# Patient Record
Sex: Female | Born: 1955 | Race: White | Hispanic: No | Marital: Married | Smoking: Never smoker
Health system: Southern US, Community
[De-identification: ages and names within clinical notes are randomized; demographics above are authoritative.]

## PROBLEM LIST (undated history)

## (undated) DIAGNOSIS — B019 Varicella without complication: Secondary | ICD-10-CM

## (undated) DIAGNOSIS — N809 Endometriosis, unspecified: Secondary | ICD-10-CM

## (undated) DIAGNOSIS — R12 Heartburn: Secondary | ICD-10-CM

## (undated) DIAGNOSIS — Z9189 Other specified personal risk factors, not elsewhere classified: Secondary | ICD-10-CM

## (undated) DIAGNOSIS — K219 Gastro-esophageal reflux disease without esophagitis: Secondary | ICD-10-CM

## (undated) DIAGNOSIS — M6281 Muscle weakness (generalized): Secondary | ICD-10-CM

## (undated) DIAGNOSIS — I10 Essential (primary) hypertension: Secondary | ICD-10-CM

## (undated) DIAGNOSIS — J302 Other seasonal allergic rhinitis: Secondary | ICD-10-CM

## (undated) DIAGNOSIS — T7840XA Allergy, unspecified, initial encounter: Secondary | ICD-10-CM

## (undated) DIAGNOSIS — M1990 Unspecified osteoarthritis, unspecified site: Secondary | ICD-10-CM

## (undated) HISTORY — DX: Other specified personal risk factors, not elsewhere classified: Z91.89

## (undated) HISTORY — DX: Other seasonal allergic rhinitis: J30.2

## (undated) HISTORY — DX: Gastro-esophageal reflux disease without esophagitis: K21.9

## (undated) HISTORY — DX: Essential (primary) hypertension: I10

## (undated) HISTORY — DX: Endometriosis, unspecified: N80.9

## (undated) HISTORY — DX: Heartburn: R12

## (undated) HISTORY — DX: Unspecified osteoarthritis, unspecified site: M19.90

## (undated) HISTORY — DX: Varicella without complication: B01.9

## (undated) HISTORY — DX: Allergy, unspecified, initial encounter: T78.40XA

## (undated) HISTORY — PX: RHINOPLASTY: SUR1284

## (undated) HISTORY — PX: WISDOM TOOTH EXTRACTION: SHX21

---
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
## 1984-01-26 HISTORY — PX: LAPAROSCOPIC ENDOMETRIOSIS FULGURATION: SUR769

## 2012-03-31 HISTORY — PX: COLONOSCOPY: SHX174

## 2014-04-26 DIAGNOSIS — Z872 Personal history of diseases of the skin and subcutaneous tissue: Secondary | ICD-10-CM

## 2014-04-26 HISTORY — DX: Personal history of diseases of the skin and subcutaneous tissue: Z87.2

## 2016-10-20 LAB — CBC AND DIFFERENTIAL
HCT: 40 (ref 36–46)
Hemoglobin: 13.7 (ref 12.0–16.0)
Platelets: 350 (ref 150–399)
WBC: 5.4

## 2016-10-20 LAB — BASIC METABOLIC PANEL
BUN: 8 (ref 4–21)
Creatinine: 0.9 (ref 0.5–1.1)
Potassium: 4.5 (ref 3.4–5.3)
Sodium: 138 (ref 137–147)

## 2016-10-20 LAB — LIPID PANEL
Cholesterol: 221 — AB (ref 0–200)
HDL: 99 — AB (ref 35–70)
LDL Cholesterol: 105
Triglycerides: 87 (ref 40–160)

## 2016-10-20 LAB — HEPATIC FUNCTION PANEL
ALT: 14 (ref 7–35)
AST: 20 (ref 13–35)
Bilirubin, Total: 0.4

## 2016-10-20 LAB — CBC: RBC: 4.22 (ref 3.87–5.11)

## 2016-10-20 LAB — TSH: TSH: 2.06 (ref 0.41–5.90)

## 2016-10-20 LAB — HEMOGLOBIN A1C: Hemoglobin A1C: 5.6

## 2016-10-20 LAB — POCT INR: INR: 1 (ref 0.9–1.1)

## 2016-10-25 HISTORY — PX: BUNIONECTOMY: SHX129

## 2016-12-25 DIAGNOSIS — M869 Osteomyelitis, unspecified: Secondary | ICD-10-CM

## 2016-12-25 HISTORY — DX: Osteomyelitis, unspecified: M86.9

## 2017-01-25 DIAGNOSIS — G928 Other toxic encephalopathy: Secondary | ICD-10-CM

## 2017-01-25 DIAGNOSIS — M6281 Muscle weakness (generalized): Secondary | ICD-10-CM

## 2017-01-25 HISTORY — DX: Muscle weakness (generalized): M62.81

## 2017-01-25 HISTORY — DX: Other toxic encephalopathy: G92.8

## 2017-02-08 LAB — HEPATIC FUNCTION PANEL
ALT: 9 (ref 7–35)
AST: 23 (ref 13–35)

## 2017-02-08 LAB — CBC: RBC: 4.05 (ref 3.87–5.11)

## 2017-02-08 LAB — CBC AND DIFFERENTIAL
HCT: 38 (ref 36–46)
Hemoglobin: 12.4 (ref 12.0–16.0)
Platelets: 350 (ref 150–399)
WBC: 7

## 2017-02-08 LAB — BASIC METABOLIC PANEL
BUN: 5 (ref 4–21)
Creatinine: 0.9 (ref 0.5–1.1)

## 2017-02-08 LAB — HM MAMMOGRAPHY

## 2017-02-08 LAB — POCT INR: INR: 0.9 (ref 0.9–1.1)

## 2018-11-17 ENCOUNTER — Other Ambulatory Visit: Payer: Self-pay

## 2018-11-17 ENCOUNTER — Encounter: Payer: Self-pay | Admitting: Family Medicine

## 2018-11-17 ENCOUNTER — Ambulatory Visit (INDEPENDENT_AMBULATORY_CARE_PROVIDER_SITE_OTHER): Payer: BC Managed Care – PPO | Admitting: Family Medicine

## 2018-11-17 VITALS — BP 128/84 | HR 82 | Temp 98.1°F | Resp 16 | Ht 62.25 in | Wt 127.2 lb

## 2018-11-17 DIAGNOSIS — I10 Essential (primary) hypertension: Secondary | ICD-10-CM | POA: Diagnosis not present

## 2018-11-17 DIAGNOSIS — Z0000 Encounter for general adult medical examination without abnormal findings: Secondary | ICD-10-CM

## 2018-11-17 DIAGNOSIS — K219 Gastro-esophageal reflux disease without esophagitis: Secondary | ICD-10-CM | POA: Insufficient documentation

## 2018-11-17 MED ORDER — FAMOTIDINE 20 MG PO TABS: 20.0000 mg | ORAL_TABLET | Freq: Two times a day (BID) | ORAL | 11 refills | Status: DC

## 2018-11-17 MED ORDER — LOSARTAN POTASSIUM-HCTZ 50-12.5 MG PO TABS: 1.0000 | ORAL_TABLET | Freq: Every day | ORAL | 5 refills | Status: DC

## 2018-11-17 NOTE — Progress Notes (Signed)
Patient ID: Jasmine Lee, female  DOB: 11/08/1955, 63 y.o.   MRN: [id number] Patient Care Team    Relationship Specialty Notifications Start End  Natalia Leatherwood, DO PCP - General Family Medicine  11/17/18     Chief Complaint  Patient presents with  . Establish Care    PCP Dr Hosie Poisson, [place]. Mammogram 02/2018, Pap 2015?Marland Kitchen Colonoscopy 8 yrs ago, repeat in 10 yrs. Records requested. Needs refill on Famotidine or change medication. Pt thinks she had Td vaccine a few years ago but is unsure.     Subjective:  Jasmine Lee is a 63 y.o.  female present for new patient establishment. All past medical history, surgical history, allergies, family history, immunizations, medications and social history were updated in the electronic medical record today. All recent labs, ED visits and hospitalizations within the last year were reviewed.  Hypertension: Pt reports compliance with losartan/HCTZ 50-12.5 mg daily. Blood pressures ranges at home within normal limits. Patient denies chest pain, shortness of breath or lower extremity edema. Pt does not take a daily baby ASA. Pt is not prescribed statin.  She does take daily 1000 mg of omega-3.   GERD: Patient reports she has been prescribed famotidine 40 mg daily.  She states this typically controls her reflux, however she has been having a hard time getting the medication refilled secondary to backorder.  Depression screen PHQ 2/9 11/17/2018  Decreased Interest 0  Down, Depressed, Hopeless 0  PHQ - 2 Score 0   No flowsheet data found.     No flowsheet data found.   There is no immunization history on file for this patient.  No exam data present  Past Medical History:  Diagnosis Date  . Arthritis   . Chicken pox   . GERD (gastroesophageal reflux disease)   . Heartburn   . History of fainting spells of unknown cause   . HTN (hypertension)   . Muscle weakness 01/2017   Patient reports being hospitalized for bilateral upper  extremity weakness with negative stroke work-up.  Negative EEG.  Self resolved  . Seasonal allergies    No Known Allergies Past Surgical History:  Procedure Laterality Date  . BUNIONECTOMY  10/2016   Patient reports multiple postsurgical infections October/November and December 2018.  Marland Kitchen COLONOSCOPY  2012  . HERNIA REPAIR  1962  . LAPAROSCOPIC ENDOMETRIOSIS FULGURATION  1986  . RHINOPLASTY     Completed for deviated septum.  . WISDOM TOOTH EXTRACTION     Family History  Problem Relation Age of Onset  . Arthritis Mother   . Hearing loss Mother   . Heart attack Mother   . Miscarriages / [place] Mother   . Hyperlipidemia Mother   . Breast cancer Mother   . Alcohol abuse Father   . Diabetes Father   . Hypertension Father   . Stroke Father   . Diabetes Sister   . Early death Sister   . Heart disease Sister   . Stroke Sister   . Arthritis Maternal Grandmother   . Hypertension Maternal Grandmother   . Diabetes Maternal Grandfather   . Early death Maternal Grandfather   . Hyperlipidemia Maternal Grandfather   . Hypertension Maternal Grandfather   . Heart attack Maternal Grandfather   . Heart attack Paternal Grandmother   . Early death Paternal Grandfather   . Heart attack Paternal Grandfather   . Depression Sister    Social History   Social History Narrative   Marital status/children/pets: Married.  No children.   Education/employment: Associates degree, retired Airline pilot.   Safety:      -smoke alarm in the home:Yes     - wears seatbelt: Yes     - Feels safe in their relationships: Yes    Allergies as of 11/17/2018   No Known Allergies     Medication List       Accurate as of November 17, 2018 11:59 PM. If you have any questions, ask your nurse or doctor.        Calcium 250 MG Caps Take 2 capsules by mouth daily.   co-enzyme Q-10 30 MG capsule Take 100 mg by mouth daily.   famotidine 20 MG tablet Commonly known as: PEPCID Take 1 tablet (20 mg  total) by mouth 2 (two) times daily. What changed:   medication strength  how much to take Changed by: Howard Pouch, DO   Fish Oil 1000 MG Caps Take by mouth.   loratadine 10 MG tablet Commonly known as: CLARITIN Take 10 mg by mouth daily.   losartan-hydrochlorothiazide 50-12.5 MG tablet Commonly known as: HYZAAR Take 1 tablet by mouth daily.   MULTIVITAMIN ADULT PO Take by mouth.       All past medical history, surgical history, allergies, family history, immunizations andmedications were updated in the EMR today and reviewed under the history and medication portions of their EMR.    No results found for this or any previous visit (from the past 2160 hour(s)).  Patient was never admitted.   ROS: 14 pt review of systems performed and negative (unless mentioned in an HPI)  Objective: BP 128/84 (BP Location: Left Arm, Patient Position: Sitting, Cuff Size: Normal)   Pulse 82   Temp 98.1 F (36.7 C) (Temporal)   Resp 16   Ht 5' 2.25" (1.581 m)   Wt 127 lb 4 oz (57.7 kg)   SpO2 98%   BMI 23.09 kg/m  Gen: Afebrile. No acute distress. Nontoxic in appearance, well-developed, well-nourished, pleasant female. HENT: AT. [place].  Eyes:Pupils Equal Round Reactive to light, Extraocular movements intact,  Conjunctiva without redness, discharge or icterus. Neck/lymp/endocrine: Supple, no lymphadenopathy, no thyromegaly CV: RRR no murmur, no edema, +2/4 P posterior tibialis pulses.  No carotid bruits. No JVD. Chest: CTAB, no wheeze, rhonchi or crackles.  Normal respiratory effort.  Good air movement. Abd: Soft.  Flat. NTND. BS present.  Skin:Warm and well-perfused. Skin intact. Neuro/Msk:  Normal gait. PERLA. EOMi. Alert. Oriented x3.  Psych: Normal affect, dress and demeanor. Normal speech. Normal thought content and judgment.   Assessment/plan: Jasmine Lee is a 63 y.o. female present for EST.  Essential hypertension Stable.  Continue losartan/HCTZ 50-12.5 mg daily.  Refills  provided for her today. Patient reports she had recent labs collected with her PCP.  Will await records.   Low-sodium diet encouraged.  Routine exercise encouraged. Follow-up in 5-6 months before needing refills.  This can be her CPE if she is due.  Once records received will be able to guide her on when to follow-up.  GERD without esophagitis Refilled Protonix 20 mg twice daily for her. Follow-up yearly with physicals   Return in about 23 weeks (around 04/27/2019) for CPE (30 min).  Greater than 30 minutes spent with patient, >50% of time spent face to face   Note is dictated utilizing voice recognition software. Although note has been proof read prior to signing, occasional typographical errors still can be missed. If any questions arise, please do not hesitate to call  for verification.  Electronically signed by: Howard Pouch, DO [place]

## 2018-11-17 NOTE — Patient Instructions (Addendum)
Nice to meet you today.  I have refilled your meds for you.  Follow up in 5-6 months for physical. They will call to schedule you.   Please help [place] help you:  We are honored you have chosen [facility] for your Primary Care home. Below you will find basic instructions that you may need to access in the future. Please help [place] help you by reading the instructions, which cover many of the frequent questions we experience.   Prescription refills and request:  -In order to allow more efficient response time, please call your pharmacy for all refills. They will forward the request electronically to [place]. This allows for the quickest possible response. Request left on a nurse line can take longer to refill, since these are checked as time allows between office patients and other phone calls.  - refill request can take up to 3-5 working days to complete.  - If request is sent electronically and request is appropiate, it is usually completed in 1-2 business days.  - all patients will need to be seen routinely for all chronic medical conditions requiring prescription medications (see follow-up below). If you are overdue for follow up on your condition, you will be asked to make an appointment and we will call in enough medication to cover you until your appointment (up to 30 days).  - all controlled substances will require a face to face visit to request/refill.  - if you desire your prescriptions to go through a new pharmacy, and have an active script at original pharmacy, you will need to call your pharmacy and have scripts transferred to new pharmacy. This is completed between the pharmacy locations and not by your provider.    Results: If any images or labs were ordered, it can take up to 1 week to get results depending on the test ordered and the lab/facility running and resulting the test. - Normal or stable results, which do not need further discussion, may be released to your mychart immediately  with attached note to you. A call may not be generated for normal results. Please make certain to sign up for mychart. If you have questions on how to activate your mychart you can call the front office.  - If your results need further discussion, our office will attempt to contact you via phone, and if unable to reach you after 2 attempts, we will release your abnormal result to your mychart with instructions.  - All results will be automatically released in mychart after 1 week.  - Your provider will provide you with explanation and instruction on all relevant material in your results. Please keep in mind, results and labs may appear confusing or abnormal to the untrained eye, but it does not mean they are actually abnormal for you personally. If you have any questions about your results that are not covered, or you desire more detailed explanation than what was provided, you should make an appointment with your provider to do so.   Our office handles many outgoing and incoming calls daily. If we have not contacted you within 1 week about your results, please check your mychart to see if there is a message first and if not, then contact our office.  In helping with this matter, you help decrease call volume, and therefore allow [place] to be able to respond to patients needs more efficiently.   Acute office visits (sick visit):  An acute visit is intended for a new problem and are scheduled  in shorter time slots to allow schedule openings for patients with new problems. This is the appropriate visit to discuss a new problem. Problems will not be addressed by phone call or Echart message. Appointment is needed if requesting treatment. In order to provide you with excellent quality medical care with proper time for you to explain your problem, have an exam and receive treatment with instructions, these appointments should be limited to one new problem per visit. If you experience a new problem, in which you  desire to be addressed, please make an acute office visit, we save openings on the schedule to accommodate you. Please do not save your new problem for any other type of visit, let us take care of it properly and quickly for you.   Follow up visits:  Depending on your condition(s) your provider will need to see you routinely in order to provide you with quality care and prescribe medication(s). Most chronic conditions (Example: hypertension, Diabetes, depression/anxiety... etc), require visits a couple times a year. Your provider will instruct you on proper follow up for your personal medical conditions and history. Please make certain to make follow up appointments for your condition as instructed. Failing to do so could result in lapse in your medication treatment/refills. If you request a refill, and are overdue to be seen on a condition, we will always provide you with a 30 day script (once) to allow you time to schedule.    Medicare wellness (well visit): - we have a wonderful Nurse Maudie Mercury), that will meet with you and provide you will yearly medicare wellness visits. These visits should occur yearly (can not be scheduled less than 1 calendar year apart) and cover preventive health, immunizations, advance directives and screenings you are entitled to yearly through your medicare benefits. Do not miss out on your entitled benefits, this is when medicare will pay for these benefits to be ordered for you.  These are strongly encouraged by your provider and is the appropriate type of visit to make certain you are up to date with all preventive health benefits. If you have not had your medicare wellness exam in the last 12 months, please make certain to schedule one by calling the office and schedule your medicare wellness with Maudie Mercury as soon as possible.   Yearly physical (well visit):  - Adults are recommended to be seen yearly for physicals. Check with your insurance and date of your last physical, most  insurances require one calendar year between physicals. Physicals include all preventive health topics, screenings, medical exam and labs that are appropriate for gender/age and history. You may have fasting labs needed at this visit. This is a well visit (not a sick visit), new problems should not be covered during this visit (see acute visit).  - Pediatric patients are seen more frequently when they are younger. Your provider will advise you on well child visit timing that is appropriate for your their age. - This is not a medicare wellness visit. Medicare wellness exams do not have an exam portion to the visit. Some medicare companies allow for a physical, some do not allow a yearly physical. If your medicare allows a yearly physical you can schedule the medicare wellness with our nurse Maudie Mercury and have your physical with your provider after, on the same day. Please check with insurance for your full benefits.   Late Policy/No Shows:  - all new patients should arrive 15-30 minutes earlier than appointment to allow [place] time  to  obtain all personal demographics,  insurance information and for you to complete office paperwork. - All established patients should arrive 10-15 minutes earlier than appointment time to update all information and be checked in .  - In our best efforts to run on time, if you are late for your appointment you will be asked to either reschedule or if able, we will work you back into the schedule. There will be a wait time to work you back in the schedule,  depending on availability.  - If you are unable to make it to your appointment as scheduled, please call 24 hours ahead of time to allow [place] to fill the time slot with someone else who needs to be seen. If you do not cancel your appointment ahead of time, you may be charged a no show fee.

## 2018-11-20 ENCOUNTER — Encounter: Payer: Self-pay | Admitting: Family Medicine

## 2018-11-20 DIAGNOSIS — Z803 Family history of malignant neoplasm of breast: Secondary | ICD-10-CM | POA: Insufficient documentation

## 2018-11-28 DIAGNOSIS — M79674 Pain in right toe(s): Secondary | ICD-10-CM | POA: Diagnosis not present

## 2018-11-28 DIAGNOSIS — M21611 Bunion of right foot: Secondary | ICD-10-CM | POA: Diagnosis not present

## 2018-11-28 DIAGNOSIS — M85871 Other specified disorders of bone density and structure, right ankle and foot: Secondary | ICD-10-CM | POA: Diagnosis not present

## 2018-11-28 DIAGNOSIS — M19071 Primary osteoarthritis, right ankle and foot: Secondary | ICD-10-CM | POA: Diagnosis not present

## 2018-11-28 DIAGNOSIS — M2042 Other hammer toe(s) (acquired), left foot: Secondary | ICD-10-CM | POA: Diagnosis not present

## 2018-11-28 DIAGNOSIS — M2041 Other hammer toe(s) (acquired), right foot: Secondary | ICD-10-CM | POA: Diagnosis not present

## 2018-12-14 ENCOUNTER — Encounter: Payer: Self-pay | Admitting: Family Medicine

## 2018-12-14 DIAGNOSIS — M1990 Unspecified osteoarthritis, unspecified site: Secondary | ICD-10-CM | POA: Insufficient documentation

## 2018-12-28 DIAGNOSIS — L408 Other psoriasis: Secondary | ICD-10-CM | POA: Diagnosis not present

## 2018-12-28 DIAGNOSIS — L570 Actinic keratosis: Secondary | ICD-10-CM | POA: Diagnosis not present

## 2019-01-09 DIAGNOSIS — M2041 Other hammer toe(s) (acquired), right foot: Secondary | ICD-10-CM | POA: Diagnosis not present

## 2019-01-09 DIAGNOSIS — L84 Corns and callosities: Secondary | ICD-10-CM | POA: Diagnosis not present

## 2019-01-09 DIAGNOSIS — M21619 Bunion of unspecified foot: Secondary | ICD-10-CM | POA: Diagnosis not present

## 2019-01-09 DIAGNOSIS — M79674 Pain in right toe(s): Secondary | ICD-10-CM | POA: Diagnosis not present

## 2019-01-11 DIAGNOSIS — X32XXXA Exposure to sunlight, initial encounter: Secondary | ICD-10-CM | POA: Diagnosis not present

## 2019-01-11 DIAGNOSIS — L570 Actinic keratosis: Secondary | ICD-10-CM | POA: Diagnosis not present

## 2019-01-27 ENCOUNTER — Encounter: Payer: Self-pay | Admitting: Family Medicine

## 2019-01-27 ENCOUNTER — Telehealth: Payer: Self-pay | Admitting: Family Medicine

## 2019-01-27 DIAGNOSIS — Z1231 Encounter for screening mammogram for malignant neoplasm of breast: Secondary | ICD-10-CM

## 2019-01-27 NOTE — Telephone Encounter (Signed)
Please inform patient I have placed Jasmine Lee mammogram order.  After reviewing Jasmine Lee with prior records it appears she will be due for Jasmine Lee repeat mammogram on February 20.  The order was placed for the breast center in [place].  Please advise Jasmine Lee of addressed in location.  They will call Jasmine Lee to schedule.  In addition please schedule Jasmine Lee for Jasmine Lee annual physical here with this provider for end of March.

## 2019-01-29 NOTE — Telephone Encounter (Signed)
Pt was called and given information. She scheduled her CPE at the end of March

## 2019-03-15 ENCOUNTER — Ambulatory Visit: Payer: BC Managed Care – PPO

## 2019-04-20 ENCOUNTER — Ambulatory Visit: Payer: Self-pay

## 2019-04-23 ENCOUNTER — Encounter: Payer: Self-pay | Admitting: Family Medicine

## 2019-04-24 ENCOUNTER — Other Ambulatory Visit: Payer: Self-pay

## 2019-04-24 ENCOUNTER — Ambulatory Visit (INDEPENDENT_AMBULATORY_CARE_PROVIDER_SITE_OTHER): Payer: 59 | Admitting: Family Medicine

## 2019-04-24 ENCOUNTER — Encounter: Payer: Self-pay | Admitting: Family Medicine

## 2019-04-24 VITALS — BP 121/86 | HR 72 | Temp 97.6°F | Resp 17 | Ht 62.0 in | Wt 128.4 lb

## 2019-04-24 DIAGNOSIS — Z114 Encounter for screening for human immunodeficiency virus [HIV]: Secondary | ICD-10-CM | POA: Diagnosis not present

## 2019-04-24 DIAGNOSIS — Z131 Encounter for screening for diabetes mellitus: Secondary | ICD-10-CM | POA: Diagnosis not present

## 2019-04-24 DIAGNOSIS — Z1159 Encounter for screening for other viral diseases: Secondary | ICD-10-CM | POA: Diagnosis not present

## 2019-04-24 DIAGNOSIS — K219 Gastro-esophageal reflux disease without esophagitis: Secondary | ICD-10-CM

## 2019-04-24 DIAGNOSIS — Z0000 Encounter for general adult medical examination without abnormal findings: Secondary | ICD-10-CM | POA: Diagnosis not present

## 2019-04-24 DIAGNOSIS — I10 Essential (primary) hypertension: Secondary | ICD-10-CM | POA: Diagnosis not present

## 2019-04-24 DIAGNOSIS — Z803 Family history of malignant neoplasm of breast: Secondary | ICD-10-CM

## 2019-04-24 DIAGNOSIS — Z5320 Procedure and treatment not carried out because of patient's decision for unspecified reasons: Secondary | ICD-10-CM

## 2019-04-24 DIAGNOSIS — E2839 Other primary ovarian failure: Secondary | ICD-10-CM

## 2019-04-24 LAB — COMPREHENSIVE METABOLIC PANEL
ALT: 13 U/L (ref 0–35)
AST: 15 U/L (ref 0–37)
Albumin: 4.1 g/dL (ref 3.5–5.2)
Alkaline Phosphatase: 139 U/L — ABNORMAL HIGH (ref 39–117)
BUN: 16 mg/dL (ref 6–23)
CO2: 32 mEq/L (ref 19–32)
Calcium: 10 mg/dL (ref 8.4–10.5)
Chloride: 98 mEq/L (ref 96–112)
Creatinine, Ser: 0.84 mg/dL (ref 0.40–1.20)
GFR: 68.52 mL/min (ref >60.00)
Glucose, Bld: 113 mg/dL — ABNORMAL HIGH (ref 70–99)
Potassium: 4.4 mEq/L (ref 3.5–5.1)
Sodium: 136 mEq/L (ref 135–145)
Total Bilirubin: 0.4 mg/dL (ref 0.2–1.2)
Total Protein: 7.3 g/dL (ref 6.0–8.3)

## 2019-04-24 LAB — LIPID PANEL
Cholesterol: 222 mg/dL — ABNORMAL HIGH (ref 0–200)
HDL: 81 mg/dL (ref >39.00)
LDL Cholesterol: 125 mg/dL — ABNORMAL HIGH (ref 0–99)
NonHDL: 141.19
Total CHOL/HDL Ratio: 3
Triglycerides: 81 mg/dL (ref 0.0–149.0)
VLDL: 16.2 mg/dL (ref 0.0–40.0)

## 2019-04-24 LAB — CBC
HCT: 41.9 % (ref 36.0–46.0)
Hemoglobin: 14.2 g/dL (ref 12.0–15.0)
MCHC: 33.8 g/dL (ref 30.0–36.0)
MCV: 96 fl (ref 78.0–100.0)
Platelets: 372 10*3/uL (ref 150.0–400.0)
RBC: 4.37 Mil/uL (ref 3.87–5.11)
RDW: 13 % (ref 11.5–15.5)
WBC: 6.6 10*3/uL (ref 4.0–10.5)

## 2019-04-24 LAB — HEMOGLOBIN A1C: Hgb A1c MFr Bld: 5.8 % (ref 4.6–6.5)

## 2019-04-24 LAB — TSH: TSH: 2.45 u[IU]/mL (ref 0.35–4.50)

## 2019-04-24 MED ORDER — LOSARTAN POTASSIUM-HCTZ 50-12.5 MG PO TABS: 1.0000 | ORAL_TABLET | Freq: Every day | ORAL | 5 refills | Status: DC

## 2019-04-24 MED ORDER — DEBROX 6.5 % OT SOLN: 5.0000 [drp] | Freq: Two times a day (BID) | OTIC | 0 refills | Status: DC

## 2019-04-24 NOTE — Patient Instructions (Signed)
COVID-19 Vaccine Information can be found at: [email or web address] For questions related to vaccine distribution or appointments, please email [email or web address] or call [phone].  Covid Vaccine appointment go to [email or web address].   Health Maintenance, Female Adopting a healthy lifestyle and getting preventive care are important in promoting health and wellness. Ask your health care provider about:  The right schedule for you to have regular tests and exams.  Things you can do on your own to prevent diseases and keep yourself healthy. What should I know about diet, weight, and exercise? Eat a healthy diet   Eat a diet that includes plenty of vegetables, fruits, low-fat dairy products, and lean protein.  Do not eat a lot of foods that are high in solid fats, added sugars, or sodium. Maintain a healthy weight Body mass index (BMI) is used to identify weight problems. It estimates body fat based on height and weight. Your health care provider can help determine your BMI and help you achieve or maintain a healthy weight. Get regular exercise Get regular exercise. This is one of the most important things you can do for your health. Most adults should:  Exercise for at least 150 minutes each week. The exercise should increase your heart rate and make you sweat (moderate-intensity exercise).  Do strengthening exercises at least twice a week. This is in addition to the moderate-intensity exercise.  Spend less time sitting. Even light physical activity can be beneficial. Watch cholesterol and blood lipids Have your blood tested for lipids and cholesterol at 63 years of age, then have this test every 5 years. Have your cholesterol levels checked more often if:  Your lipid or cholesterol levels are high.  You are older than 63 years of age.  You are at high risk for heart disease. What should I know about cancer  screening? Depending on your health history and family history, you may need to have cancer screening at various ages. This may include screening for:  Breast cancer.  Cervical cancer.  Colorectal cancer.  Skin cancer.  Lung cancer. What should I know about heart disease, diabetes, and high blood pressure? Blood pressure and heart disease  High blood pressure causes heart disease and increases the risk of stroke. This is more likely to develop in people who have high blood pressure readings, are of African descent, or are overweight.  Have your blood pressure checked: ? Every 3-5 years if you are 18-39 years of age. ? Every year if you are 40 years old or older. Diabetes Have regular diabetes screenings. This checks your fasting blood sugar level. Have the screening done:  Once every three years after age 40 if you are at a normal weight and have a low risk for diabetes.  More often and at a younger age if you are overweight or have a high risk for diabetes. What should I know about preventing infection? Hepatitis B If you have a higher risk for hepatitis B, you should be screened for this virus. Talk with your health care provider to find out if you are at risk for hepatitis B infection. Hepatitis C Testing is recommended for:  Everyone born from 1945 through 1965.  Anyone with known risk factors for hepatitis C. Sexually transmitted infections (STIs)  Get screened for STIs, including gonorrhea and chlamydia, if: ? You are sexually active and are younger than 63 years of age. ? You are older than 63 years of age and your health care provider tells you that you are at risk for   this type of infection. ? Your sexual activity has changed since you were last screened, and you are at increased risk for chlamydia or gonorrhea. Ask your health care provider if you are at risk.  Ask your health care provider about whether you are at high risk for HIV. Your health care provider may  recommend a prescription medicine to help prevent HIV infection. If you choose to take medicine to prevent HIV, you should first get tested for HIV. You should then be tested every 3 months for as long as you are taking the medicine. Pregnancy  If you are about to stop having your period (premenopausal) and you may become pregnant, seek counseling before you get pregnant.  Take 400 to 800 micrograms (mcg) of folic acid every day if you become pregnant.  Ask for birth control (contraception) if you want to prevent pregnancy. Osteoporosis and menopause Osteoporosis is a disease in which the bones lose minerals and strength with aging. This can result in bone fractures. If you are 65 years old or older, or if you are at risk for osteoporosis and fractures, ask your health care provider if you should:  Be screened for bone loss.  Take a calcium or vitamin D supplement to lower your risk of fractures.  Be given hormone replacement therapy (HRT) to treat symptoms of menopause. Follow these instructions at home: Lifestyle  Do not use any products that contain nicotine or tobacco, such as cigarettes, e-cigarettes, and chewing tobacco. If you need help quitting, ask your health care provider.  Do not use street drugs.  Do not share needles.  Ask your health care provider for help if you need support or information about quitting drugs. Alcohol use  Do not drink alcohol if: ? Your health care provider tells you not to drink. ? You are pregnant, may be pregnant, or are planning to become pregnant.  If you drink alcohol: ? Limit how much you use to 0-1 drink a day. ? Limit intake if you are breastfeeding.  Be aware of how much alcohol is in your drink. In the [place] one drink equals one 12 oz bottle of beer (355 mL), one 5 oz glass of wine (148 mL), or one 1 oz glass of hard liquor (44 mL). General instructions  Schedule regular health, dental, and eye exams.  Stay current with your  vaccines.  Tell your health care provider if: ? You often feel depressed. ? You have ever been abused or do not feel safe at home. Summary  Adopting a healthy lifestyle and getting preventive care are important in promoting health and wellness.  Follow your health care provider's instructions about healthy diet, exercising, and getting tested or screened for diseases.  Follow your health care provider's instructions on monitoring your cholesterol and blood pressure. This information is not intended to replace advice given to you by your health care provider. Make sure you discuss any questions you have with your health care provider. Document Revised: 01/04/2018 Document Reviewed: 01/04/2018 Elsevier Patient Education  2020 [employer]   

## 2019-04-24 NOTE — Progress Notes (Signed)
This visit occurred during the SARS-CoV-2 public health emergency.  Safety protocols were in place, including screening questions prior to the visit, additional usage of staff PPE, and extensive cleaning of exam room while observing appropriate contact time as indicated for disinfecting solutions.    Patient ID: Jasmine Lee, female  DOB: 1956/01/03, 64 y.o.   MRN: [id number] Patient Care Team    Relationship Specialty Notifications Start End  Ma Hillock, DO PCP - General Family Medicine  11/17/18     Chief Complaint  Patient presents with  . Annual Exam    Fasting. Last Mammogram 03/15/2018. Last Pap smear 2015. Would like referral to GYN.     Subjective:  Jasmine Lee is a 64 y.o.  Female  present for CPE. All past medical history, surgical history, allergies, family history, immunizations, medications and social history were updated in the electronic medical record today. All recent labs, ED visits and hospitalizations within the last year were reviewed.  Health maintenance:  Colonoscopy: Colonoscopy completed in 2014.  10-year follow-up per patient. Mammogram: completed: 2020, has scheduled for 06/26/2019 Cervical cancer screening: last pap: 2015> patient would like referral to GYN Immunizations: tdap UTD 2015, Influenza declined (encouraged yearly), COVID #1 04/15/19, Shingrix declined today she will think about it and call back after her Covid series if interested and can have by a nurse visit.  infectious disease screening: HIV and Hep C doing patient agreeable to testing today. DEXA: Estrogen deficient.  Bone density due.  Ordered today. Assistive device: None Oxygen use: None Patient has a Dental home. Hospitalizations/ED visits: Viewed  Reflux: Patient reports she has noticed over the last year she has experienced more episodes of "choking."  She feels it became worse when she started wearing a mask more frequently.  She has noticed over the last few weeks her  postnasal drip has increased.  She is prescribed a PPI for chronic GERD.  She feels like the dripping or phlegm is the cause of her choking.  She denies any current problems with swallowing mechanism.   Hypertension: Pt reports compliance with losartan/HCTZ 50-12.5 mg daily. Blood pressures ranges at home within normal limits. Patient denies chest pain, shortness of breath or lower extremity edema. Pt does not take a daily baby ASA. Pt is not prescribed statin.  She does take daily 1000 mg of omega-3   Depression screen [facility] 2/9 04/24/2019 11/17/2018  Decreased Interest 0 0  Down, Depressed, Hopeless 1 0  PHQ - 2 Score 1 0   No flowsheet data found.  Immunization History  Administered Date(s) Administered  . PFIZER SARS-COV-2 Vaccination 04/15/2019  . Tdap 01/24/2014   Past Medical History:  Diagnosis Date  . Arthritis   . Chicken pox   . GERD (gastroesophageal reflux disease)   . Heartburn   . History of fainting spells of unknown cause   . History of sun-damaged skin 04/26/2014   Actinic keratosis.  Solar elastosis.  Benign nevus of right posterior neck.  Marland Kitchen HTN (hypertension)   . Muscle weakness 01/2017   Patient reports being hospitalized for bilateral upper extremity weakness with negative stroke work-up.  Negative EEG.  Self resolved  . Osteomyelitis ([place]) 12/2016   left ankle/foot. Patient had multiple surgeries and treated with antibiotics.  . Seasonal allergies   . Toxic metabolic encephalopathy 40/9811   Extensive negative work-up reported by PCP records.  Thought to be secondary to IV antibiotics for osteomyelitis.   Allergies  Allergen Reactions  . Oxycodone  Past Surgical History:  Procedure Laterality Date  . BUNIONECTOMY  10/2016   Patient reports multiple postsurgical infections October/November and December 2018.  Marland Kitchen COLONOSCOPY  03/31/2012   Nonbleeding internal hemorrhoids otherwise normal exam.  Repeat in 10 years.  Marland Kitchen HERNIA REPAIR  1962  .  LAPAROSCOPIC ENDOMETRIOSIS FULGURATION  1986  . RHINOPLASTY     Completed for deviated septum.  . WISDOM TOOTH EXTRACTION     Family History  Problem Relation Age of Onset  . Arthritis Mother   . Hearing loss Mother   . Heart attack Mother   . Miscarriages / [place] Mother   . Hyperlipidemia Mother   . Breast cancer Mother   . Alcohol abuse Father   . Diabetes Father   . Hypertension Father   . Stroke Father   . Diabetes Sister   . Early death Sister   . Heart disease Sister   . Stroke Sister   . Arthritis Maternal Grandmother   . Hypertension Maternal Grandmother   . Diabetes Maternal Grandfather   . Early death Maternal Grandfather   . Hyperlipidemia Maternal Grandfather   . Hypertension Maternal Grandfather   . Heart attack Maternal Grandfather   . Heart attack Paternal Grandmother   . Early death Paternal Grandfather   . Heart attack Paternal Grandfather   . Depression Sister    Social History   Social History Narrative   Marital status/children/pets: Married.  No children.   Education/employment: Associates degree, retired Counsellor.   Safety:      -smoke alarm in the home:Yes     - wears seatbelt: Yes     - Feels safe in their relationships: Yes    Allergies as of 04/24/2019      Reactions   Oxycodone       Medication List       Accurate as of April 24, 2019  1:06 PM. If you have any questions, ask your nurse or doctor.        aspirin EC 81 MG tablet Take 81 mg by mouth daily.   Calcium 250 MG Caps Take 2 capsules by mouth daily.   co-enzyme Q-10 30 MG capsule Take 100 mg by mouth daily.   Debrox 6.5 % OTIC solution Generic drug: carbamide peroxide Place 5 drops into the left ear 2 (two) times daily. Started by: Jasmine Pacini, DO   famotidine 20 MG tablet Commonly known as: PEPCID Take 1 tablet (20 mg total) by mouth 2 (two) times daily.   Fish Oil 1000 MG Caps Take by mouth.   loratadine 10 MG tablet Commonly known as:  CLARITIN Take 10 mg by mouth daily.   losartan-hydrochlorothiazide 50-12.5 MG tablet Commonly known as: HYZAAR Take 1 tablet by mouth daily.   MULTIVITAMIN ADULT PO Take by mouth.   PROBIOTIC ADVANCED PO Take by mouth.       All past medical history, surgical history, allergies, family history, immunizations andmedications were updated in the EMR today and reviewed under the history and medication portions of their EMR.     No results found for this or any previous visit (from the past 2160 hour(s)).  Patient was never admitted.   ROS: 14 pt review of systems performed and negative (unless mentioned in an HPI)  Objective: BP 121/86 (BP Location: Left Arm, Patient Position: Sitting, Cuff Size: Normal)   Pulse 72   Temp 97.6 F (36.4 C) (Temporal)   Resp 17   Ht 5\' 2"  (1.575 m)  Wt 128 lb 6 oz (58.2 kg)   BMI 23.48 kg/m  Gen: Afebrile. No acute distress. Nontoxic in appearance, well-developed, well-nourished, pleasant Caucasian female. HENT: AT. [place]. Bilateral TM visualized and normal in appearance, normal external auditory canal. MMM, no oral lesions, adequate dentition. Bilateral nares within normal limits. Throat without erythema, ulcerations or exudates.  No cough on exam, no hoarseness on exam. Eyes:Pupils Equal Round Reactive to light, Extraocular movements intact,  Conjunctiva without redness, discharge or icterus. Neck/lymp/endocrine: Supple, no lymphadenopathy, no thyromegaly CV: RRR no murmur, no edema, +2/4 P posterior tibialis pulses.  No carotid bruits. No JVD. Chest: CTAB, no wheeze, rhonchi or crackles.  Normal respiratory effort.  Good air movement. Abd: Soft.  Flat. NTND. BS present.  No masses palpated. No hepatosplenomegaly. No rebound tenderness or guarding. Skin: No rashes, purpura or petechiae. Warm and well-perfused. Skin intact. Neuro/Msk:  Normal gait. PERLA. EOMi. Alert. Oriented x3.  Cranial nerves II through XII intact. Muscle strength 5/5  upper/lower extremity. DTRs equal bilaterally. Psych: Normal affect, dress and demeanor. Normal speech. Normal thought content and judgment.   No exam data present  Assessment/plan: Jasmine Lee is a 64 y.o. female present for CPE and [facility] Essential hypertension Stable.   Continue losartan/HCTZ 50-12.5 mg daily.    Low-sodium diet encouraged.  Routine exercise encouraged. - CBC - Comprehensive metabolic panel - Lipid panel - TSH Follow-up 5 and half months GERD without esophagitis Continue Pepcid twice daily. Her worsening symptoms sound possibly allergy related, encouraged her to start Zyrtec before bed for 2 weeks.  If not seeing improvement in her symptoms would then encourage her to do a trial of over-the-counter Prilosec daily. Follow-up as needed  Need for hepatitis C screening test - Hepatitis C Antibody  Encounter for screening for HIV - HIV antibody (with reflex)  Estrogen deficiency - DG Bone Density; Future  Diabetes mellitus screening - Hemoglobin A1c  Cervical cancer screening declined Refer to GYN per patient choice - Ambulatory referral to Gynecology  Breast cancer screening/family history of breast cancer> has scheduled.   Encounter for preventative health examination Patient was encouraged to exercise greater than 150 minutes a week. Patient was encouraged to choose a diet filled with fresh fruits and vegetables, and lean meats. AVS provided to patient today for education/recommendation on gender specific health and safety maintenance. Colonoscopy: Colonoscopy completed in 2014.  10-year follow-up per patient. Mammogram: completed: 2020, has scheduled for 06/26/2019 Cervical cancer screening: last pap: 2015> patient would like referral to GYN Immunizations: tdap UTD 2015, Influenza declined (encouraged yearly), COVID #1 04/15/19, Shingrix declined today she will think about it and call back after her Covid series if interested and can have by a nurse  visit.  infectious disease screening: HIV and Hep C doing patient agreeable to testing today.  Return in about 6 months (around 10/25/2019) for CMC (30 min) and 1 year cpe.  Orders Placed This Encounter  Procedures  . DG Bone Density  . CBC  . Comprehensive metabolic panel  . Hemoglobin A1c  . Lipid panel  . TSH  . Hepatitis C Antibody  . HIV antibody (with reflex)  . Ambulatory referral to Gynecology    Meds ordered this encounter  Medications  . losartan-hydrochlorothiazide (HYZAAR) 50-12.5 MG tablet    Sig: Take 1 tablet by mouth daily.    Dispense:  30 tablet    Refill:  5  . carbamide peroxide (DEBROX) 6.5 % OTIC solution    Sig: Place 5 drops into  the left ear 2 (two) times daily.    Dispense:  15 mL    Refill:  0    Referral Orders     Ambulatory referral to Gynecology   Electronically signed by: Jasmine Pacini, DO [facility]

## 2019-04-25 LAB — HEPATITIS C ANTIBODY
Hepatitis C Ab: NONREACTIVE
SIGNAL TO CUT-OFF: 0.01 (ref <1.00)

## 2019-04-25 LAB — HIV ANTIBODY (ROUTINE TESTING W REFLEX): HIV 1&2 Ab, 4th Generation: NONREACTIVE

## 2019-04-26 ENCOUNTER — Other Ambulatory Visit: Payer: Self-pay | Admitting: Family Medicine

## 2019-04-26 ENCOUNTER — Telehealth: Payer: Self-pay | Admitting: Family Medicine

## 2019-04-26 ENCOUNTER — Encounter: Payer: Self-pay | Admitting: Family Medicine

## 2019-04-26 DIAGNOSIS — E2839 Other primary ovarian failure: Secondary | ICD-10-CM

## 2019-04-26 NOTE — Telephone Encounter (Signed)
Please inform patient the following information: -Her kidney and thyroid function are normal. -Blood cell counts and electrolytes are in normal range. -Hepatitis C and HIV screenings are negative. -Her A1c/diabetes screen is 5.8 and her fasting glucose is 113.  These are both just mildly elevated > but not in the diabetic range.  No medications are needed at this time but I would encourage her to increase her exercise regimen and make sure to avoid regularly consuming high sugar products (sugary snacks, sodas, desserts etc.)  Cholesterol panel looks great and is at goal.  Her good cholesterol/HDL is 81> this is great.  Her bad cholesterol/LDL is 125 and this is at goal.  Her liver function is normal however she does have an elevated alkaline phosphatase.  This enzyme is found in multiple places within the body including the liver and bones.  I do not have record of prior alkaline phosphatase levels on her to compare, this can be her normal.  Or it could be secondary to a bone condition-such as osteopenia, osteoporosis, low vitamin D.  Your liver conditions such as gallstones or even some medications/supplements can cause.   -Recommendations are to have her complete her bone density which we ordered to rule out osteopenia or osteoporosis.  RB>Can you have her schedule this within the next few weeks instead of waiting until her mammogram is due.     -If she is not taking a vitamin D supplement, I would recommend she take 800 units of vitamin D daily with food.   -Follow-up with this provider in about 2-3 months and we will recheck levels and discuss need of further eval if levels remain elevated

## 2019-04-26 NOTE — Telephone Encounter (Signed)
Pt was called and given all information, she verbalized understanding. She has scheduled her bone density scan. F/U appt has been made

## 2019-05-16 ENCOUNTER — Other Ambulatory Visit: Payer: Self-pay

## 2019-05-17 ENCOUNTER — Ambulatory Visit (INDEPENDENT_AMBULATORY_CARE_PROVIDER_SITE_OTHER): Payer: 59 | Admitting: Nurse Practitioner

## 2019-05-17 ENCOUNTER — Encounter: Payer: Self-pay | Admitting: Nurse Practitioner

## 2019-05-17 VITALS — BP 124/80 | Ht 61.0 in | Wt 125.0 lb

## 2019-05-17 DIAGNOSIS — Z01419 Encounter for gynecological examination (general) (routine) without abnormal findings: Secondary | ICD-10-CM

## 2019-05-17 DIAGNOSIS — Z1151 Encounter for screening for human papillomavirus (HPV): Secondary | ICD-10-CM

## 2019-05-17 DIAGNOSIS — Z124 Encounter for screening for malignant neoplasm of cervix: Secondary | ICD-10-CM | POA: Diagnosis not present

## 2019-05-17 NOTE — Progress Notes (Signed)
   Jasmine Lee 09/11/1955 [id number]   History:  64 y.o. MWF G0 presents as a new patient to establish care without GYN complaints.  Did not receive medical care for 5+ years, recently established with a PCP who manages her hypertension. Family history of breast cancer in mother, aunt, and cousin. Reports having DEXA around 20 years ago due to history of multiple fractures. Says she was told she had osteopenia.  Takes calcium and vitamin D daily. Moved here in September from [place], lost job due to pandemic.  Has 2 dogs.  Has began walking 2 miles a day now that the weather is nice.   Gynecologic History No LMP recorded. Patient is postmenopausal.   Contraception: post menopausal status Last Pap: UNK  Last mammogram: 03/15/2018. Results were: normal  Past medical history, past surgical history, family history and social history were all reviewed and documented in the EPIC chart.  ROS:  A ROS was performed and pertinent positives and negatives are included.  Exam:  Vitals:   05/17/19 0953  BP: 124/80  Weight: 125 lb (56.7 kg)  Height: 5\' 1"  (1.549 m)   Body mass index is 23.62 kg/m.  General appearance:  Normal Thyroid:  Symmetrical, normal in size, without palpable masses or nodularity. Respiratory  Auscultation:  Clear without wheezing or rhonchi Cardiovascular  Auscultation:  Regular rate, without rubs, murmurs or gallops  Edema/varicosities:  Not grossly evident Abdominal  Soft,nontender, without masses, guarding or rebound.  Liver/spleen:  No organomegaly noted  Hernia:  None appreciated  Skin  Inspection:  Grossly normal   Breasts: Examined lying and sitting.   Right: Without masses, retractions, discharge or axillary adenopathy.   Left: Without masses, retractions, discharge or axillary adenopathy. Gentitourinary   Inguinal/mons:  Normal without inguinal adenopathy  External genitalia:  Normal  BUS/Urethra/Skene's glands:  Normal  Vagina:  Normal  Cervix:   Normal  Uterus:  Anteverted, normal in size, shape and contour.  Midline and mobile  Adnexa/parametria:     Rt: Without masses or tenderness.   Lt: Without masses or tenderness.  Anus and perineum: Normal  Assessment/Plan:  64 y.o.  Presents to establish care.   Well female exam with routine gynecological exam Pap with HR HPV pending, labs managed by PCP.  Scheduled for mammogram and DEXA on 06/26/2019.  Education provided on SBEs, calcium and vitamin D supplements, and regular exercise including weightbearing exercises.  Follow-up in 1 year for annual.     08/26/2019 [facility], 10:45 AM 05/17/2019

## 2019-05-17 NOTE — Patient Instructions (Addendum)
Multivitamin and calcium with Vitamin D daily Walk daily  Health Maintenance, Female Adopting a healthy lifestyle and getting preventive care are important in promoting health and wellness. Ask your health care provider about:  The right schedule for you to have regular tests and exams.  Things you can do on your own to prevent diseases and keep yourself healthy. What should I know about diet, weight, and exercise? Eat a healthy diet   Eat a diet that includes plenty of vegetables, fruits, low-fat dairy products, and lean protein.  Do not eat a lot of foods that are high in solid fats, added sugars, or sodium. Maintain a healthy weight Body mass index (BMI) is used to identify weight problems. It estimates body fat based on height and weight. Your health care provider can help determine your BMI and help you achieve or maintain a healthy weight. Get regular exercise Get regular exercise. This is one of the most important things you can do for your health. Most adults should:  Exercise for at least 150 minutes each week. The exercise should increase your heart rate and make you sweat (moderate-intensity exercise).  Do strengthening exercises at least twice a week. This is in addition to the moderate-intensity exercise.  Spend less time sitting. Even light physical activity can be beneficial. Watch cholesterol and blood lipids Have your blood tested for lipids and cholesterol at 63 years of age, then have this test every 5 years. Have your cholesterol levels checked more often if:  Your lipid or cholesterol levels are high.  You are older than 63 years of age.  You are at high risk for heart disease. What should I know about cancer screening? Depending on your health history and family history, you may need to have cancer screening at various ages. This may include screening for:  Breast cancer.  Cervical cancer.  Colorectal cancer.  Skin cancer.  Lung cancer. What should  I know about heart disease, diabetes, and high blood pressure? Blood pressure and heart disease  High blood pressure causes heart disease and increases the risk of stroke. This is more likely to develop in people who have high blood pressure readings, are of African descent, or are overweight.  Have your blood pressure checked: ? Every 3-5 years if you are 74-49 years of age. ? Every year if you are 29 years old or older. Diabetes Have regular diabetes screenings. This checks your fasting blood sugar level. Have the screening done:  Once every three years after age 27 if you are at a normal weight and have a low risk for diabetes.  More often and at a younger age if you are overweight or have a high risk for diabetes. What should I know about preventing infection? Hepatitis B If you have a higher risk for hepatitis B, you should be screened for this virus. Talk with your health care provider to find out if you are at risk for hepatitis B infection. Hepatitis C Testing is recommended for:  Everyone born from [birth year] through 1964.  Anyone with known risk factors for hepatitis C. Sexually transmitted infections (STIs)  Get screened for STIs, including gonorrhea and chlamydia, if: ? You are sexually active and are younger than 63 years of age. ? You are older than 63 years of age and your health care provider tells you that you are at risk for this type of infection. ? Your sexual activity has changed since you were last screened, and you are at increased risk  for chlamydia or gonorrhea. Ask your health care provider if you are at risk.  Ask your health care provider about whether you are at high risk for HIV. Your health care provider may recommend a prescription medicine to help prevent HIV infection. If you choose to take medicine to prevent HIV, you should first get tested for HIV. You should then be tested every 3 months for as long as you are taking the medicine. Pregnancy  If you are  about to stop having your period (premenopausal) and you may become pregnant, seek counseling before you get pregnant.  Take 400 to 800 micrograms (mcg) of folic acid every day if you become pregnant.  Ask for birth control (contraception) if you want to prevent pregnancy. Osteoporosis and menopause Osteoporosis is a disease in which the bones lose minerals and strength with aging. This can result in bone fractures. If you are 51 years old or older, or if you are at risk for osteoporosis and fractures, ask your health care provider if you should:  Be screened for bone loss.  Take a calcium or vitamin D supplement to lower your risk of fractures.  Be given hormone replacement therapy (HRT) to treat symptoms of menopause. Follow these instructions at home: Lifestyle  Do not use any products that contain nicotine or tobacco, such as cigarettes, e-cigarettes, and chewing tobacco. If you need help quitting, ask your health care provider.  Do not use street drugs.  Do not share needles.  Ask your health care provider for help if you need support or information about quitting drugs. Alcohol use  Do not drink alcohol if: ? Your health care provider tells you not to drink. ? You are pregnant, may be pregnant, or are planning to become pregnant.  If you drink alcohol: ? Limit how much you use to 0-1 drink a day. ? Limit intake if you are breastfeeding.  Be aware of how much alcohol is in your drink. In the [place] one drink equals one 12 oz bottle of beer (355 mL), one 5 oz glass of wine (148 mL), or one 1 oz glass of hard liquor (44 mL). General instructions  Schedule regular health, dental, and eye exams.  Stay current with your vaccines.  Tell your health care provider if: ? You often feel depressed. ? You have ever been abused or do not feel safe at home. Summary  Adopting a healthy lifestyle and getting preventive care are important in promoting health and wellness.  Follow  your health care provider's instructions about healthy diet, exercising, and getting tested or screened for diseases.  Follow your health care provider's instructions on monitoring your cholesterol and blood pressure. This information is not intended to replace advice given to you by your health care provider. Make sure you discuss any questions you have with your health care provider. Document Revised: 01/04/2018 Document Reviewed: 01/04/2018 Elsevier Patient Education  2020 [employer].

## 2019-05-18 LAB — PAP, TP IMAGING W/ HPV RNA, RFLX HPV TYPE 16,18/45: HPV DNA High Risk: NOT DETECTED

## 2019-06-26 ENCOUNTER — Ambulatory Visit: Payer: Self-pay

## 2019-06-26 ENCOUNTER — Other Ambulatory Visit: Payer: Self-pay

## 2019-06-26 ENCOUNTER — Ambulatory Visit
Admission: RE | Admit: 2019-06-26 | Discharge: 2019-06-26 | Disposition: A | Discharge status: Home / Self Care | Payer: 59 | Source: Ambulatory Visit | Attending: Family Medicine | Admitting: Family Medicine

## 2019-06-26 DIAGNOSIS — Z1231 Encounter for screening mammogram for malignant neoplasm of breast: Secondary | ICD-10-CM

## 2019-06-26 DIAGNOSIS — E2839 Other primary ovarian failure: Secondary | ICD-10-CM

## 2019-07-03 ENCOUNTER — Encounter: Payer: Self-pay | Admitting: Family Medicine

## 2019-07-25 ENCOUNTER — Ambulatory Visit (INDEPENDENT_AMBULATORY_CARE_PROVIDER_SITE_OTHER): Payer: 59 | Admitting: Family Medicine

## 2019-07-25 ENCOUNTER — Encounter: Payer: Self-pay | Admitting: Family Medicine

## 2019-07-25 ENCOUNTER — Other Ambulatory Visit: Payer: Self-pay

## 2019-07-25 VITALS — BP 118/82 | HR 67 | Temp 97.4°F | Resp 17 | Ht 61.0 in | Wt 126.0 lb

## 2019-07-25 DIAGNOSIS — R748 Abnormal levels of other serum enzymes: Secondary | ICD-10-CM | POA: Insufficient documentation

## 2019-07-25 DIAGNOSIS — M8580 Other specified disorders of bone density and structure, unspecified site: Secondary | ICD-10-CM | POA: Diagnosis not present

## 2019-07-25 LAB — VITAMIN D 25 HYDROXY (VIT D DEFICIENCY, FRACTURES): VITD: 58.59 ng/mL (ref 30.00–100.00)

## 2019-07-25 LAB — COMPREHENSIVE METABOLIC PANEL
ALT: 15 U/L (ref 0–35)
AST: 18 U/L (ref 0–37)
Albumin: 4.2 g/dL (ref 3.5–5.2)
Alkaline Phosphatase: 155 U/L — ABNORMAL HIGH (ref 39–117)
BUN: 11 mg/dL (ref 6–23)
CO2: 30 mEq/L (ref 19–32)
Calcium: 9.7 mg/dL (ref 8.4–10.5)
Chloride: 96 mEq/L (ref 96–112)
Creatinine, Ser: 0.81 mg/dL (ref 0.40–1.20)
GFR: 71.4 mL/min (ref >60.00)
Glucose, Bld: 108 mg/dL — ABNORMAL HIGH (ref 70–99)
Potassium: 4.3 mEq/L (ref 3.5–5.1)
Sodium: 135 mEq/L (ref 135–145)
Total Bilirubin: 0.5 mg/dL (ref 0.2–1.2)
Total Protein: 7.5 g/dL (ref 6.0–8.3)

## 2019-07-25 NOTE — Progress Notes (Signed)
This visit occurred during the SARS-CoV-2 public health emergency.  Safety protocols were in place, including screening questions prior to the visit, additional usage of staff PPE, and extensive cleaning of exam room while observing appropriate contact time as indicated for disinfecting solutions.    Jasmine Lee , Jun 01, 1955, 64 y.o., female MRN: [id number] Patient Care Team    Relationship Specialty Notifications Start End  Ma Hillock, DO PCP - General Family Medicine  11/17/18   Orville Govern, MD Referring Physician   04/26/19   Powers, Belia Heman, DPM Referring Physician Surgery  04/26/19     Chief Complaint  Patient presents with  . Follow-up    recheck labs. Fasting      Subjective: Pt presents for an OV in concerns to elevated alkaline phosphatase/osteopenia:   Bone density completed 06/26/2019 -1.5 osteopenia result. Pt was instructed to start Vit d 800 units daily. She reports she started the vit d, but does not know what dose. She is trying to obtain routine daily exercise by walking.  She does not recall ever being told she had an elevated alk phos in the past. She denies h/o RUQ pain or bone pain. She denies nausea or vomit. She has her gallbladder, but states her siblings and Father had to have gallbladder removals, she is uncertain of reason.   Depression screen [facility] 2/9 04/24/2019 11/17/2018  Decreased Interest 0 0  Down, Depressed, Hopeless 1 0  PHQ - 2 Score 1 0    Allergies  Allergen Reactions  . Oxycodone Other (See Comments)    Feel out of it   Social History   Social History Narrative   Marital status/children/pets: Married.  No children.   Education/employment: Associates degree, retired Airline pilot.   Safety:      -smoke alarm in the home:Yes     - wears seatbelt: Yes     - Feels safe in their relationships: Yes   Past Medical History:  Diagnosis Date  . Arthritis   . Chicken pox   . GERD (gastroesophageal reflux disease)   . Heartburn    . History of fainting spells of unknown cause   . History of sun-damaged skin 04/26/2014   Actinic keratosis.  Solar elastosis.  Benign nevus of right posterior neck.  Marland Kitchen HTN (hypertension)   . Muscle weakness 01/2017   Patient reports being hospitalized for bilateral upper extremity weakness with negative stroke work-up.  Negative EEG.  Self resolved  . Osteomyelitis ([place]) 12/2016   left ankle/foot. Patient had multiple surgeries and treated with antibiotics.  . Seasonal allergies   . Toxic metabolic encephalopathy 40/9811   Extensive negative work-up reported by PCP records.  Thought to be secondary to IV antibiotics for osteomyelitis.   Past Surgical History:  Procedure Laterality Date  . BUNIONECTOMY  10/2016   Patient reports multiple postsurgical infections October/November and December 2018.  Marland Kitchen COLONOSCOPY  03/31/2012   Nonbleeding internal hemorrhoids otherwise normal exam.  Repeat in 10 years.  Marland Kitchen [place]  . LAPAROSCOPIC ENDOMETRIOSIS FULGURATION  1986  . RHINOPLASTY     Completed for deviated septum.  . WISDOM TOOTH EXTRACTION     Family History  Problem Relation Age of Onset  . Arthritis Mother   . Hearing loss Mother   . Heart attack Mother   . Miscarriages / [place] Mother   . Hyperlipidemia Mother   . Breast cancer Mother 3  . Alcohol abuse Father   . Diabetes  Father   . Hypertension Father   . Stroke Father   . Diabetes Sister   . Early death Sister   . Heart disease Sister   . Stroke Sister   . Arthritis Maternal Grandmother   . Hypertension Maternal Grandmother   . Diabetes Maternal Grandfather   . Early death Maternal Grandfather   . Hyperlipidemia Maternal Grandfather   . Hypertension Maternal Grandfather   . Heart attack Maternal Grandfather   . Heart attack Paternal Grandmother   . Early death Paternal Grandfather   . Heart attack Paternal Grandfather   . Depression Sister   . Ovarian cancer Maternal Aunt    Allergies as of  07/25/2019      Reactions   Oxycodone Other (See Comments)   Feel out of it      Medication List       Accurate as of July 25, 2019  8:52 AM. If you have any questions, ask your nurse or doctor.        STOP taking these medications   Debrox 6.5 % OTIC solution Generic drug: carbamide peroxide Stopped by: Howard Pouch, DO   loratadine 10 MG tablet Commonly known as: CLARITIN Stopped by: Howard Pouch, DO     TAKE these medications   aspirin EC 81 MG tablet Take 81 mg by mouth daily.   Calcium 250 MG Caps Take 2 capsules by mouth daily.   cetirizine 10 MG tablet Commonly known as: ZYRTEC Take 10 mg by mouth daily.   co-enzyme Q-10 30 MG capsule Take 100 mg by mouth daily.   famotidine 20 MG tablet Commonly known as: PEPCID Take 1 tablet (20 mg total) by mouth 2 (two) times daily.   Fish Oil 1000 MG Caps Take by mouth.   losartan-hydrochlorothiazide 50-12.5 MG tablet Commonly known as: HYZAAR Take 1 tablet by mouth daily.   MULTIVITAMIN ADULT PO Take by mouth.   PROBIOTIC ADVANCED PO Take by mouth.   VITAMIN D PO Take by mouth.       All past medical history, surgical history, allergies, family history, immunizations andmedications were updated in the EMR today and reviewed under the history and medication portions of their EMR.     ROS: Negative, with the exception of above mentioned in HPI   Objective:  BP 118/82 (BP Location: Left Arm, Patient Position: Sitting, Cuff Size: Normal)   Pulse 67   Temp (!) 97.4 F (36.3 C) (Temporal)   Resp 17   Ht 5' 1"  (1.549 m)   Wt 126 lb (57.2 kg)   SpO2 100%   BMI 23.81 kg/m  Body mass index is 23.81 kg/m. Gen: Afebrile. No acute distress. Nontoxic in appearance, well developed, well nourished.  HENT: AT. [place].  Eyes:Pupils Equal Round Reactive to light, Extraocular movements intact,  Conjunctiva without redness, discharge or icterus. Abd: Soft. flat. NTND. BS present. no Masses palpated. No rebound or  guarding.  Skin: no rashes, purpura or petechiae.  Neuro:  Normal gait. PERLA. EOMi. Alert. Oriented x3  Psych: Normal affect, dress and demeanor. Normal speech. Normal thought content and judgment.  No exam data present No results found. No results found for this or any previous visit (from the past 24 hour(s)).  Assessment/Plan: Jasmine Lee is a 64 y.o. female present for OV for  Osteopenia, unspecified location Discussed pts DEXA report with her today>T score -1.5 She has started unknown dose of vit d.  Discussed start of prescribed medication if/when indicated by FRAX score  1.) Drink alcohol in moderation only: No more than 4 drinks a day for men and no more than 2 drinks a day for women.  2.) Decrease caffeine consumption to no  More than 2.5 cups of coffee a day or nor more than 5 cups of tea a day. 3.) Exercise: weight bearing (walking counts), strength and balance training. 4.) No smoking.  5.) Sunlight/Ultraviolet light exposure 30 minutes a day/5 days a week. 6.) Vitamin D supplement 800 iu a day (at least, more if told otherwise) and calcium 1000-1200 mg a day (depending on diet and age).  - rpt dexa 3-5 years - Vitamin D (25 hydroxy) - Comp Met (CMET)  Elevated alkaline phosphatase level Uncertain etiology or baseline for pt.  - continue osteopenia recommendations above. No h/o of gallstones or symptoms. No bone pain reported.  If levels stable or improved no further eval needed> reassured.  - Vitamin D (25 hydroxy) - Comp Met (CMET)   Reviewed expectations re: course of current medical issues.  Discussed self-management of symptoms.  Outlined signs and symptoms indicating need for more acute intervention.  Patient verbalized understanding and all questions were answered.  Patient received an After-Visit Summary.    Orders Placed This Encounter  Procedures  . Vitamin D (25 hydroxy)  . Comp Met (CMET)   No orders of the defined types were placed in  this encounter.  Referral Orders  No referral(s) requested today     Note is dictated utilizing voice recognition software. Although note has been proof read prior to signing, occasional typographical errors still can be missed. If any questions arise, please do not hesitate to call for verification.   electronically signed by:  Howard Pouch, DO  [place]

## 2019-07-25 NOTE — Patient Instructions (Signed)
Osteopenia  Osteopenia is a loss of thickness (density) inside of the bones. Another name for osteopenia is low bone mass. Mild osteopenia is a normal part of aging. It is not a disease, and it does not cause symptoms. However, if you have osteopenia and continue to lose bone mass, you could develop a condition that causes the bones to become thin and break more easily (osteoporosis). You may also lose some height, have back pain, and have a stooped posture. Although osteopenia is not a disease, making changes to your lifestyle and diet can help to prevent osteopenia from developing into osteoporosis. What are the causes? Osteopenia is caused by loss of calcium in the bones.  Bones are constantly changing. Old bone cells are continually being replaced with new bone cells. This process builds new bone. The mineral calcium is needed to build new bone and maintain bone density. Bone density is usually highest around age 35. After that, most people's bodies cannot replace all the bone they have lost with new bone. What increases the risk? You are more likely to develop this condition if:  You are older than age 50.  You are a woman who went through menopause early.  You have a long illness that keeps you in bed.  You do not get enough exercise.  You lack certain nutrients (malnutrition).  You have an overactive thyroid gland (hyperthyroidism).  You smoke.  You drink a lot of alcohol.  You are taking medicines that weaken the bones, such as steroids. What are the signs or symptoms? This condition does not cause any symptoms. You may have a slightly higher risk for bone breaks (fractures), so getting fractures more easily than normal may be an indication of osteopenia. How is this diagnosed? Your health care provider can diagnose this condition with a special type of X-ray exam that measures bone density (dual-energy X-ray absorptiometry, DEXA). This test can measure bone density in your  hips, spine, and wrists. Osteopenia has no symptoms, so this condition is usually diagnosed after a routine bone density screening test is done for osteoporosis. This routine screening is usually done for:  Women who are age 65 or older.  Men who are age 70 or older. If you have risk factors for osteopenia, you may have the screening test at an earlier age. How is this treated? Making dietary and lifestyle changes can lower your risk for osteoporosis. If you have severe osteopenia that is close to becoming osteoporosis, your health care provider may prescribe medicines and dietary supplements such as calcium and vitamin D. These supplements help to rebuild bone density. Follow these instructions at home:   Take over-the-counter and prescription medicines only as told by your health care provider. These include vitamins and supplements.  Eat a diet that is high in calcium and vitamin D. ? Calcium is found in dairy products, beans, salmon, and leafy green vegetables like spinach and broccoli. ? Look for foods that have vitamin D and calcium added to them (fortified foods), such as orange juice, cereal, and bread.  Do 30 or more minutes of a weight-bearing exercise every day, such as walking, jogging, or playing a sport. These types of exercises strengthen the bones.  Take precautions at home to lower your risk of falling, such as: ? Keeping rooms well-lit and free of clutter, such as cords. ? Installing safety rails on stairs. ? Using rubber mats in the bathroom or other areas that are often wet or slippery.  Do not use   any products that contain nicotine or tobacco, such as cigarettes and e-cigarettes. If you need help quitting, ask your health care provider.  Avoid alcohol or limit alcohol intake to no more than 1 drink a day for nonpregnant women and 2 drinks a day for men. One drink equals 12 oz of beer, 5 oz of wine, or 1 oz of hard liquor.  Keep all follow-up visits as told by your  health care provider. This is important. Contact a health care provider if:  You have not had a bone density screening for osteoporosis and you are: ? A woman, age 65 or older. ? A man, age 70 or older.  You are a postmenopausal woman who has not had a bone density screening for osteoporosis.  You are older than age 50 and you want to know if you should have bone density screening for osteoporosis. Summary  Osteopenia is a loss of thickness (density) inside of the bones. Another name for osteopenia is low bone mass.  Osteopenia is not a disease, but it may increase your risk for a condition that causes the bones to become thin and break more easily (osteoporosis).  You may be at risk for osteopenia if you are older than age 50 or if you are a woman who went through early menopause.  Osteopenia does not cause any symptoms, but it can be diagnosed with a bone density screening test.  Dietary and lifestyle changes are the first treatment for osteopenia. These may lower your risk for osteoporosis. This information is not intended to replace advice given to you by your health care provider. Make sure you discuss any questions you have with your health care provider. Document Revised: 12/24/2016 Document Reviewed: 10/20/2016 Elsevier Patient Education  2020 Elsevier Inc.  

## 2019-07-26 ENCOUNTER — Telehealth: Payer: Self-pay | Admitting: Family Medicine

## 2019-07-26 DIAGNOSIS — R748 Abnormal levels of other serum enzymes: Secondary | ICD-10-CM

## 2019-07-26 NOTE — Telephone Encounter (Signed)
Please inform patient Jasmine Lee alkaline phosphatase has increased to 155. I have ordered two additional tests for Jasmine Lee to have completed to gain a better understanding if the elevated alk phos is related to bone turnover or liver causes.  Please schedule Jasmine Lee for this lab appointment.  If GGT is elevated will refer to gastroenterology for further evaluation.  If PTH is elevated would refer to endocrinology.  She is asymptomatic at this time so we do not have to rush.  Please schedule Jasmine Lee for a lab appointment July 14 or after.

## 2019-07-27 NOTE — Telephone Encounter (Signed)
Pt was called and given information, she verbalized understanding. Scheduled lab appt

## 2019-08-08 ENCOUNTER — Other Ambulatory Visit: Payer: Self-pay

## 2019-08-08 ENCOUNTER — Ambulatory Visit (INDEPENDENT_AMBULATORY_CARE_PROVIDER_SITE_OTHER): Payer: 59 | Admitting: Family Medicine

## 2019-08-08 DIAGNOSIS — R748 Abnormal levels of other serum enzymes: Secondary | ICD-10-CM

## 2019-08-10 ENCOUNTER — Telehealth: Payer: Self-pay | Admitting: Family Medicine

## 2019-08-10 DIAGNOSIS — R748 Abnormal levels of other serum enzymes: Secondary | ICD-10-CM

## 2019-08-10 LAB — GAMMA GT: GGT: 16 U/L (ref 3–65)

## 2019-08-10 LAB — PTH, INTACT AND CALCIUM
Calcium: 9.4 mg/dL (ref 8.6–10.4)
PTH: 32 pg/mL (ref 14–64)

## 2019-08-10 NOTE — Telephone Encounter (Addendum)
Please inform patient the following information: GGT test is normal >> this suggest the elevation in her alkaline phosphatase is not caused by her liver.    Since GGT test is normal, it is more likely alkaline phosphatase levels are caused by a bone condition.  Further evaluation on cause is typically performed by an endocrinologist.  I have referred her to endocrine for evaluation.  Her parathyroid hormone levels are also normal therefore this is not a parathyroid cause.

## 2019-08-10 NOTE — Telephone Encounter (Signed)
  There are many types of bone conditions that can cause elevated alkaline phosphatase such as osteoporosis or osteomalacia-all of which has to do with bone density and/or mineralization of the bone. Her current levels are not extremely high as seen with the more worrisome conditions such as cancers.  However, these levels need to be followed, she should have further evaluation by endocrinology to  be certain.

## 2019-08-10 NOTE — Telephone Encounter (Signed)
Pt was called and given information, she verbalized understanding  

## 2019-08-10 NOTE — Telephone Encounter (Signed)
Pt was called and given information, she verbalized understanding. She is asking what bone conditions it possible could be and was very worried on the phone. I told her I would have to send message to Dr Claiborne Billings and get back with her.

## 2019-11-09 ENCOUNTER — Other Ambulatory Visit: Payer: Self-pay

## 2019-11-09 ENCOUNTER — Encounter: Payer: Self-pay | Admitting: Internal Medicine

## 2019-11-09 ENCOUNTER — Ambulatory Visit: Payer: 59 | Admitting: Internal Medicine

## 2019-11-09 VITALS — BP 140/86 | HR 85 | Temp 97.8°F | Ht 61.0 in | Wt 126.1 lb

## 2019-11-09 DIAGNOSIS — R748 Abnormal levels of other serum enzymes: Secondary | ICD-10-CM | POA: Diagnosis not present

## 2019-11-09 LAB — COMPREHENSIVE METABOLIC PANEL
ALT: 17 U/L (ref 0–35)
AST: 24 U/L (ref 0–37)
Albumin: 4.4 g/dL (ref 3.5–5.2)
Alkaline Phosphatase: 150 U/L — ABNORMAL HIGH (ref 39–117)
BUN: 13 mg/dL (ref 6–23)
CO2: 30 mEq/L (ref 19–32)
Calcium: 9.8 mg/dL (ref 8.4–10.5)
Chloride: 92 mEq/L — ABNORMAL LOW (ref 96–112)
Creatinine, Ser: 0.85 mg/dL (ref 0.40–1.20)
GFR: 72.73 mL/min (ref >60.00)
Glucose, Bld: 104 mg/dL — ABNORMAL HIGH (ref 70–99)
Potassium: 4.3 mEq/L (ref 3.5–5.1)
Sodium: 132 mEq/L — ABNORMAL LOW (ref 135–145)
Total Bilirubin: 0.5 mg/dL (ref 0.2–1.2)
Total Protein: 8.1 g/dL (ref 6.0–8.3)

## 2019-11-09 LAB — VITAMIN D 25 HYDROXY (VIT D DEFICIENCY, FRACTURES): VITD: 54.82 ng/mL (ref 30.00–100.00)

## 2019-11-09 LAB — TSH: TSH: 1.95 u[IU]/mL (ref 0.35–4.50)

## 2019-11-09 NOTE — Patient Instructions (Signed)
-   Stop by the lab today   - Will contact you through mychart with results

## 2019-11-09 NOTE — Progress Notes (Signed)
Name: Jasmine Lee  MRN/ DOB: [id number], 09/22/1955    Age/ Sex: 64 y.o., female    PCP: Jasmine Hillock, DO   Reason for Endocrinology Evaluation: Elevated Alkaline Phosphatase     Date of Initial Endocrinology Evaluation: 11/09/2019     HPI: Jasmine Lee is a 64 y.o. female with a past medical history of HTN. The patient presented for initial endocrinology clinic visit on 11/09/2019 for consultative assistance with her elevated Alkaline Phosphatase.   Pt has been noted with elevated Alkaline Phosphatse in 03/2019 this has been trending up. Her PTH, TSh and calcium levels are normal as well as renal function.    Bone density (06/26/2019)  shows low bone density with a T- score of -1.5 at the spine.    Weight stable  She is on a lot of supplements  She read online that this could be caused by Claritin   She has had multiple fractures as a child but in adulthood had foot fracture, tripped over a curb while wearing heels.    She had a bunion sx of the left foot in 2018 with delayed healing and complications to include infections  Has right foot neuropathy  Has occasional arthritic pain of the right hand   Mother with osteoporosis but no paget's disease       HISTORY:  Past Medical History:  Past Medical History:  Diagnosis Date  . Arthritis   . Chicken pox   . GERD (gastroesophageal reflux disease)   . Heartburn   . History of fainting spells of unknown cause   . History of sun-damaged skin 04/26/2014   Actinic keratosis.  Solar elastosis.  Benign nevus of right posterior neck.  Marland Kitchen HTN (hypertension)   . Muscle weakness 01/2017   Patient reports being hospitalized for bilateral upper extremity weakness with negative stroke work-up.  Negative EEG.  Self resolved  . Osteomyelitis ([place]) 12/2016   left ankle/foot. Patient had multiple surgeries and treated with antibiotics.  . Seasonal allergies   . Toxic metabolic encephalopathy 86/3817   Extensive negative  work-up reported by PCP records.  Thought to be secondary to IV antibiotics for osteomyelitis.   Past Surgical History:  Past Surgical History:  Procedure Laterality Date  . BUNIONECTOMY  10/2016   Patient reports multiple postsurgical infections October/November and December 2018.  Marland Kitchen COLONOSCOPY  03/31/2012   Nonbleeding internal hemorrhoids otherwise normal exam.  Repeat in 10 years.  Marland Kitchen [place]  . LAPAROSCOPIC ENDOMETRIOSIS FULGURATION  1986  . RHINOPLASTY     Completed for deviated septum.  . WISDOM TOOTH EXTRACTION        Social History:  reports that she has never smoked. She has never used smokeless tobacco. She reports current alcohol use. She reports that she does not use drugs.  Family History: family history includes Alcohol abuse in her father; Arthritis in her maternal grandmother and mother; Breast cancer (age of onset: 45) in her mother; Depression in her sister; Diabetes in her father, maternal grandfather, and sister; Early death in her maternal grandfather, paternal grandfather, and sister; Hearing loss in her mother; Heart attack in her maternal grandfather, mother, paternal grandfather, and paternal grandmother; Heart disease in her sister; Hyperlipidemia in her maternal grandfather and mother; Hypertension in her father, maternal grandfather, and maternal grandmother; Miscarriages / [place] in her mother; Ovarian cancer in her maternal aunt; Stroke in her father and sister.   HOME MEDICATIONS: Allergies as of 11/09/2019  Reactions   Oxycodone Other (See Comments), Rash   Feel out of it Feel out of it      Medication List       Accurate as of November 09, 2019  9:07 AM. If you have any questions, ask your nurse or doctor.        aspirin EC 81 MG tablet Take 81 mg by mouth daily.   Calcium 250 MG Caps Take 2 capsules by mouth daily.   cetirizine 10 MG tablet Commonly known as: ZYRTEC Take 10 mg by mouth daily.   co-enzyme Q-10 30 MG  capsule Take 100 mg by mouth daily.   famotidine 20 MG tablet Commonly known as: PEPCID Take 1 tablet (20 mg total) by mouth 2 (two) times daily.   Fish Oil 1000 MG Caps Take by mouth.   losartan-hydrochlorothiazide 50-12.5 MG tablet Commonly known as: HYZAAR Take 1 tablet by mouth daily.   MILK THISTLE PO Take by mouth.   MULTIVITAMIN ADULT PO Take by mouth.   PROBIOTIC ADVANCED PO Take by mouth.   VITAMIN D PO Take by mouth.         REVIEW OF SYSTEMS: A comprehensive ROS was conducted with the patient and is negative except as per HPI    OBJECTIVE:  VS: BP 140/86   Pulse 85   Temp 97.8 F (36.6 C) (Temporal)   Ht 5' 1"  (1.549 m)   Wt 126 lb 2 oz (57.2 kg)   SpO2 97%   BMI 23.83 kg/m    Wt Readings from Last 3 Encounters:  11/09/19 126 lb 2 oz (57.2 kg)  07/25/19 126 lb (57.2 kg)  05/17/19 125 lb (56.7 kg)     EXAM: General: Pt appears well and is in NAD  Neck: General: Supple without adenopathy. Thyroid: Thyroid size normal.  No goiter or nodules appreciated.   Lungs: Clear with good BS bilat with no rales, rhonchi, or wheezes  Heart: Auscultation: RRR.  Abdomen: Normoactive bowel sounds, soft, nontender, without masses or organomegaly palpable  Extremities:  BL LE: No pretibial edema normal ROM and strength.  Skin: Hair: Texture and amount normal with gender appropriate distribution Skin Inspection: No rashes Skin Palpation: Skin temperature, texture, and thickness normal to palpation  Neuro: Cranial nerves: II - XII grossly intact  Motor: Normal strength throughout DTRs: 2+ and symmetric in UE without delay in relaxation phase  Mental Status: Judgment, insight: Intact Orientation: Oriented to time, place, and person Mood and affect: No depression, anxiety, or agitation     DATA REVIEWED:   Results for Jasmine, Lee (MRN [id number]) as of 11/09/2019 16:12  Ref. Range 11/09/2019 09:25  Sodium Latest Ref Range: 135 - 145 mEq/L 132 (L)    Potassium Latest Ref Range: 3.5 - 5.1 mEq/L 4.3  Chloride Latest Ref Range: 96 - 112 mEq/L 92 (L)  CO2 Latest Ref Range: 19 - 32 mEq/L 30  Glucose Latest Ref Range: 70 - 99 mg/dL 104 (H)  BUN Latest Ref Range: 6 - 23 mg/dL 13  Creatinine Latest Ref Range: 0.40 - 1.20 mg/dL 0.85  Calcium Latest Ref Range: 8.4 - 10.5 mg/dL 9.8  Alkaline Phosphatase Latest Ref Range: 39 - 117 U/L 150 (H)  Albumin Latest Ref Range: 3.5 - 5.2 g/dL 4.4  AST Latest Ref Range: 0 - 37 U/L 24  ALT Latest Ref Range: 0 - 35 U/L 17  Total Protein Latest Ref Range: 6.0 - 8.3 g/dL 8.1  Total Bilirubin Latest Ref Range: 0.2 -  1.2 mg/dL 0.5  GFR Latest Ref Range: >60.00 mL/min 72.73  VITD Latest Ref Range: 30.00 - 100.00 ng/mL 54.82  TSH Latest Ref Range: 0.35 - 4.50 uIU/mL 1.95    ASSESSMENT/PLAN/RECOMMENDATIONS:   1. Elevated Alkaline phosphatase:   - Elevated Alkaline phosphatase could be of hepatic, intestinal or bone origin. - Elevated Alkaline phosphatase of bone origin is due to increase osteoblastic activity.  - Causes include : hyperthyroidism, hyperparathyroidism, osteomalacia , recent fractures, paget's disease or familial causes.   - Will proceed with Alk. Phos isozyme  - PTH, Ca, and TSH are all normal   F/U pending labs results   Signed electronically by: Mack Guise, MD  [facility]  [facility] [address] [place],  [address] Phone: [phone] FAX: [phone]   CC: Jasmine Hillock, DO [address] [place] [address] Phone: [phone] Fax: [phone]   Return to Endocrinology clinic as below: No future appointments.

## 2019-11-12 LAB — PARATHYROID HORMONE, INTACT (NO CA): PTH: 13 pg/mL — ABNORMAL LOW (ref 14–64)

## 2019-11-15 ENCOUNTER — Telehealth: Payer: Self-pay | Admitting: Internal Medicine

## 2019-11-15 LAB — ALKALINE PHOSPHATASE, ISOENZYMES
Alkaline Phosphatase: 174 IU/L — ABNORMAL HIGH (ref 44–121)
BONE FRACTION: 12 % — ABNORMAL LOW (ref 14–68)
INTESTINAL FRAC.: 0 % (ref 0–18)
LIVER FRACTION: 88 % — ABNORMAL HIGH (ref 18–85)

## 2019-11-15 NOTE — Telephone Encounter (Signed)
Attempted to call the pt on 11/15/2019 at 1300 but the call went straight to voice mail.  Results for BENNIE, SCAFF (MRN [id number]) as of 11/15/2019 13:10  Ref. Range 11/09/2019 09:25  Alkaline Phosphatase Latest Ref Range: 44 - 121 IU/L 174 (H)  BONE FRACTION Latest Ref Range: 14 - 68 % 12 (L)  INTESTINAL FRAC. Latest Ref Range: 0 - 18 % 0  LIVER FRACTION Latest Ref Range: 18 - 85 % 88 (H)    A portal message was sent  The majority of her elevated Alk. Phos is of hepatic origin  Pt will need a referral to Gratz, MD  [facility]  [facility] [address] [place], [place] [address] Phone: [phone] FAX: [phone]

## 2019-11-16 ENCOUNTER — Telehealth: Payer: Self-pay | Admitting: Family Medicine

## 2019-11-16 DIAGNOSIS — R748 Abnormal levels of other serum enzymes: Secondary | ICD-10-CM

## 2019-11-16 NOTE — Telephone Encounter (Signed)
Patient advised and voiced understanding.  

## 2019-11-16 NOTE — Telephone Encounter (Signed)
Please inform patient I did place the Gastroenterology referral in for her, per request of her endocrinologist for her elevated alk phos. They will call to schedule her.

## 2019-11-16 NOTE — Telephone Encounter (Signed)
Thank you. I placed the referral today for her and asked staff to inform her.

## 2019-12-10 ENCOUNTER — Other Ambulatory Visit: Payer: Self-pay | Admitting: Family Medicine

## 2020-01-04 ENCOUNTER — Encounter: Payer: Self-pay | Admitting: Gastroenterology

## 2020-01-10 ENCOUNTER — Other Ambulatory Visit: Payer: Self-pay | Admitting: Family Medicine

## 2020-01-26 HISTORY — PX: ESOPHAGOGASTRODUODENOSCOPY ENDOSCOPY: SHX5814

## 2020-02-11 ENCOUNTER — Other Ambulatory Visit: Payer: Self-pay | Admitting: Family Medicine

## 2020-02-14 ENCOUNTER — Other Ambulatory Visit: Payer: Self-pay | Admitting: Family Medicine

## 2020-02-15 ENCOUNTER — Encounter: Payer: Self-pay | Admitting: Family Medicine

## 2020-02-15 ENCOUNTER — Other Ambulatory Visit: Payer: Self-pay | Admitting: Family Medicine

## 2020-02-17 ENCOUNTER — Other Ambulatory Visit: Payer: Self-pay | Admitting: Family Medicine

## 2020-02-20 ENCOUNTER — Other Ambulatory Visit (HOSPITAL_COMMUNITY): Payer: Self-pay

## 2020-02-20 ENCOUNTER — Ambulatory Visit: Payer: 59 | Admitting: Gastroenterology

## 2020-02-20 ENCOUNTER — Other Ambulatory Visit: Payer: Self-pay

## 2020-02-20 ENCOUNTER — Encounter: Payer: Self-pay | Admitting: Gastroenterology

## 2020-02-20 VITALS — BP 140/82 | HR 66 | Ht 62.0 in | Wt 127.0 lb

## 2020-02-20 DIAGNOSIS — R1310 Dysphagia, unspecified: Secondary | ICD-10-CM

## 2020-02-20 DIAGNOSIS — R748 Abnormal levels of other serum enzymes: Secondary | ICD-10-CM | POA: Diagnosis not present

## 2020-02-20 NOTE — Patient Instructions (Addendum)
LABS:   I have recommended labs today to further evaluate your elevated alkaline phosphatase. Your provider has requested that you go to the basement level for lab work before leaving today. Press "B" on the elevator. The lab is located at the first door on the left as you exit the elevator.  HEALTHCARE LAWS AND MY CHART RESULTS: Due to recent changes in healthcare laws, you may see the results of your imaging and laboratory studies on MyChart before your provider has had a chance to review them.  We understand that in some cases there may be results that are confusing or concerning to you. Not all laboratory results come back in the same time frame and the provider may be waiting for multiple results in order to interpret others.  Please give [place] 48 hours in order for your provider to thoroughly review all the results before contacting the office for clarification of your results.   MODIFIED BARIUM SWALLOW:  You have been scheduled for a modified barium swallow on 02/26/20 at 11am. Please arrive at 10:45am for registration.   LOCATION:  You will go to [facility] Radiology (1st Floor) for your appointment.   NEED TO RESCHEDULE:  Please contact [phone] to reschedule  WHY AM I HAVING THIS EXAM:  A Modified Barium Swallow Study, or MBS, is a special x-ray that is taken to check swallowing skills. It is carried out by a Marine scientist and a Warehouse manager (SLP). During this test, yourmouth, throat, and esophagus, a muscular tube which connects your mouth to your stomach, is checked. The test will help you, your doctor, and the SLP plan what types of foods and liquids are easier for you to swallow. The SLP will also identify positions and ways to help you swallow more easily and safely. What will happen during an MBS? You will be taken to an x-ray room and seated comfortably. You will be asked to swallow small amounts of food and liquid mixed with barium. Barium is a liquid or paste  that allows images of your mouth, throat and esophagus to be seen on x-ray. The x-ray captures moving images of the food you are swallowing as it travels from your mouth through your throat and into your esophagus. This test helps identify whether food or liquid is entering your lungs (aspiration). The test also shows which part of your mouth or throat lacks strength or coordination to move the food or liquid in the right direction. This test typically takes 30 minutes to 1 hour to complete.  ENDOSCOPY:   You have been scheduled for an endoscopy. Please follow written instructions given to you at your visit today.  INHALERS:   If you use inhalers (even only as needed), please bring them with you on the day of your procedure.  ABDOMINAL ULTRASOUND:  You have been scheduled for an abdominal ultrasound at [facility] Radiology (1st floor of the hospital) on 02/29/20 at 11AM. Please arrive @ 10:45AM for registration.   PREP:   Please DO NOT eat or drink anything after midnight the night before your exam.  NEED TO RESCHEDULE?   Please call radiology at [phone].  WHY ARE YOU HAVING THIS EXAM?   To evaluate the shape and size of the liver and the tubes around the liver  BMI:  If you are age 64 or younger, your body mass index should be between 19-25. Your There is no height or weight on file to calculate BMI. If this is out of the  aformentioned range listed, please consider follow up with your Primary Care Provider.   Thank you for trusting me with your gastrointestinal care!    Tressia Danas, MD, MPH

## 2020-02-20 NOTE — Progress Notes (Signed)
Referring Provider: Ma Hillock, DO Primary Care Physician:  Ma Hillock, DO  Reason for Consultation: Elevated alk phos   IMPRESSION:  Elevated alk phos with normal GGTP: No stigmata of liver disease by history, labs, or physical exam. These tests are usually elevated in parallel with the alkaline phosphatase in liver disorders but are not increased in bone disorders. However, alk phos isoenzymes show a slight increase in liver with a nearly preserved ratio of liver:bone.  We discussed the possibilities of bile duct abnormalities, fatty liver, tumors, infiltrative disease and medication side effects including herbal therapies/OTC treatments. I reviewed all of the medications on her list with [facility] and could only identify rare, primarily hepatocellular injury. Will proceed with further evaluation with both labs and imaging.  GERD controlled with medications, now with dysphagia. Symptoms sound most consistent with oropharyngeal etiologies, but, odynophagia expands the differential. Will plan both EGD with biopsies +/- dilation and MBBS. Will continue Pepcid at this time as reflux is well controlled. Consider switch to PPI therapy based on endoscopy findings.   Colonoscopy cancer screening: Colonoscopy from 2014 reviewed. Colonoscopy will be due 2024.   PLAN: - Hepatic function panel, AMA, IgM, ANA, IgG, GGTP, 5" nucleotidase - Fasting insulin and glucose to screen for insulin resistance/fatty liver - RUQ ultrasound to evaluate the hepatic parenchyma, low threshold to follow-up with MRI/MRCP - EGD with possible biopsies +/- dilation - Referral to Speech pathology for modified barium swallow - Surveillance colonoscopy 2024  Please see the "Patient Instructions" section for addition details about the plan.  HPI: Jasmine Lee is a 65 y.o. female referred by Dr. Raoul Pitch for an elevated alkaline phosphatase.  The history is obtained through the patient and review of her electronic  health record.  She has a history of endometriosis, arthritis, and hypertension.  She had a septorhinoplasty for a deviated septum.  Found to have an elevated alk phos 03/2019 that has been trending up.  10 months ago the alkaline phosphatase was 139, 3 months ago it was 174.  GGT was normal. Evaluated by endocrinology who do not think she has underlying bone disease.   Alk phos isoenzymes 11/09/2019 showed 88% liver, 12% bone, although this is essentially normal distribution of alk phos.  No prior knowledge of elevated liver enzymes or liver disease. No pruritus, jaundice, acholic stools, or rash.   She takes multiple supplements including milk thistle.    Bone density 06/2019 showed low bone density.  Although referred for the elevated alk phos, she is much more concerned about reflux and dysphagia present over the last year. She has longstanding heartburn that has been controlled on Pepcid 20 mg BID most days x 1 year. Uses Tums for breakthrough symptoms. Has frequent choking when she swallows - occurs with both liquids. Frequent odynophagia with solids. She feels like she is swallowing wrong.  Is on Claritin for nasal drainage.    There is no sore throat, neck pain, dysphonia, nausea, vomiting, abdominal pain, blood in the stool.  Appetite is good. Weight is stable, although she is trying to lose weight.   Labs 11/09/2019 show a sodium of 132, creatinine 0.85, total bilirubin 0.5, AST 24, ALT 17, alk phos 150, vitamin D 54, TSH 1.95  No prior abdominal imaging.  Sister under evaluation for dysphagia and is having an EGD with dilation today.  No family history of liver disease. No known family history of colon cancer or polyps. No family history of uterine/endometrial cancer, pancreatic cancer or  gastric/stomach cancer.  I have personally reviewed her colonoscopy report from her procedure with Dr. Ileana Roup at [facility] 03/31/2012. The procedure was performed to the cecum.   The bowel prep was excellent.  Grade 1 internal hemorrhoids were seen.  The exam was otherwise normal.  Repeat colonoscopy recommended in 10 years.   Past Medical History:  Diagnosis Date  . Arthritis   . Chicken pox   . Endometriosis   . GERD (gastroesophageal reflux disease)   . Heartburn   . History of fainting spells of unknown cause   . History of sun-damaged skin 04/26/2014   Actinic keratosis.  Solar elastosis.  Benign nevus of right posterior neck.  Marland Kitchen HTN (hypertension)   . Muscle weakness 01/2017   Patient reports being hospitalized for bilateral upper extremity weakness with negative stroke work-up.  Negative EEG.  Self resolved  . Osteomyelitis ([place]) 12/2016   left ankle/foot. Patient had multiple surgeries and treated with antibiotics.  . Seasonal allergies   . Toxic metabolic encephalopathy 70/2637   Extensive negative work-up reported by PCP records.  Thought to be secondary to IV antibiotics for osteomyelitis.    Past Surgical History:  Procedure Laterality Date  . BUNIONECTOMY  10/2016   Patient reports multiple postsurgical infections October/November and December 2018.  Marland Kitchen COLONOSCOPY  03/31/2012   Nonbleeding internal hemorrhoids otherwise normal exam.  Repeat in 10 years.  Marland Kitchen [place]  . LAPAROSCOPIC ENDOMETRIOSIS FULGURATION  1986  . RHINOPLASTY     Completed for deviated septum.  . WISDOM TOOTH EXTRACTION      Current Outpatient Medications  Medication Sig Dispense Refill  . aspirin EC 81 MG tablet Take 81 mg by mouth daily.    . Calcium 250 MG CAPS Take 2 capsules by mouth daily.    Marland Kitchen co-enzyme Q-10 30 MG capsule Take 100 mg by mouth daily.    . famotidine (PEPCID) 20 MG tablet Take 1 tablet by mouth twice daily 32 tablet 0  . loratadine (CLARITIN) 10 MG tablet Take 10 mg by mouth daily.    Marland Kitchen losartan-hydrochlorothiazide (HYZAAR) 50-12.5 MG tablet Take 1 tablet by mouth once daily 16 tablet 0  . Multiple Vitamins-Minerals (MULTIVITAMIN ADULT  PO) Take by mouth.    . Omega-3 Fatty Acids (FISH OIL) 1000 MG CAPS Take by mouth.    . Probiotic Product (PROBIOTIC ADVANCED PO) Take by mouth daily.    . vitamin C (ASCORBIC ACID) 250 MG tablet Take 250 mg by mouth daily.    Marland Kitchen VITAMIN D PO Take 2 each by mouth. gummies     No current facility-administered medications for this visit.    Allergies as of 02/20/2020 - Review Complete 02/20/2020  Allergen Reaction Noted  . Oxycodone Other (See Comments) and Rash 04/24/2019  . Heparin  02/20/2020    Family History  Problem Relation Age of Onset  . Arthritis Mother   . Hearing loss Mother   . Heart attack Mother   . Miscarriages / [place] Mother   . Hyperlipidemia Mother   . Breast cancer Mother 27  . Alcohol abuse Father   . Diabetes Father   . Hypertension Father   . Stroke Father   . Diabetes Sister   . Early death Sister   . Heart disease Sister   . Stroke Sister   . Arthritis Maternal Grandmother   . Hypertension Maternal Grandmother   . Diabetes Maternal Grandfather   . Early death Maternal Grandfather   .  Hyperlipidemia Maternal Grandfather   . Hypertension Maternal Grandfather   . Heart attack Maternal Grandfather   . Heart attack Paternal Grandmother   . Early death Paternal Grandfather   . Heart attack Paternal Grandfather   . Depression Sister   . Ovarian cancer Maternal Aunt   . Colon cancer Neg Hx     Social History   Socioeconomic History  . Marital status: Married    Spouse name: Not on file  . Number of children: Not on file  . Years of education: Not on file  . Highest education level: Not on file  Occupational History  . Not on file  Tobacco Use  . Smoking status: Never Smoker  . Smokeless tobacco: Never Used  Vaping Use  . Vaping Use: Never used  Substance and Sexual Activity  . Alcohol use: Yes    Alcohol/week: 14.0 standard drinks    Types: 14 Standard drinks or equivalent per week    Comment: wine and liquor daily   . Drug use:  Never  . Sexual activity: Not Currently    Comment: 1st intercourse 56 yo- 5 partners  Other Topics Concern  . Not on file  Social History Narrative   Marital status/children/pets: Married.  No children.   Education/employment: Associates degree, retired Airline pilot.   Safety:      -smoke alarm in the home:Yes     - wears seatbelt: Yes     - Feels safe in their relationships: Yes   Social Determinants of Health   Financial Resource Strain: Not on file  Food Insecurity: Not on file  Transportation Needs: Not on file  Physical Activity: Not on file  Stress: Not on file  Social Connections: Not on file  Intimate Partner Violence: Not on file    Review of Systems: 12 system ROS is negative except as noted above with the addition of allergies and arthritis.   Physical Exam: General:   Alert, in NAD. No scleral icterus. No bilateral temporal wasting.  No xanthelasma. Heart:  Regular rate and rhythm; no murmurs Pulm: Clear anteriorly; no wheezing Abdomen:  Soft. Nontender. Nondistended. Normal bowel sounds. No rebound or guarding. No fluid wave.  No hepatosplenomegaly. LAD: No inguinal or umbilical LAD Extremities:  Without edema. Neurologic:  Alert and  oriented x4;  grossly normal neurologically; no asterixis or clonus. Skin: No jaundice. No palmar erythema or spider angioma.  No Terry's nails. Psych:  Alert and cooperative. Normal mood and affect.    Avyn Coate L. Tarri Glenn, MD, MPH 02/20/2020, 9:33 AM

## 2020-02-21 ENCOUNTER — Other Ambulatory Visit (INDEPENDENT_AMBULATORY_CARE_PROVIDER_SITE_OTHER): Payer: 59

## 2020-02-21 DIAGNOSIS — R1310 Dysphagia, unspecified: Secondary | ICD-10-CM | POA: Diagnosis not present

## 2020-02-21 DIAGNOSIS — R748 Abnormal levels of other serum enzymes: Secondary | ICD-10-CM

## 2020-02-21 LAB — HEPATIC FUNCTION PANEL
ALT: 16 U/L (ref 0–35)
AST: 21 U/L (ref 0–37)
Albumin: 4.6 g/dL (ref 3.5–5.2)
Alkaline Phosphatase: 140 U/L — ABNORMAL HIGH (ref 39–117)
Bilirubin, Direct: 0.1 mg/dL (ref 0.0–0.3)
Total Bilirubin: 0.5 mg/dL (ref 0.2–1.2)
Total Protein: 8.4 g/dL — ABNORMAL HIGH (ref 6.0–8.3)

## 2020-02-21 LAB — GAMMA GT: GGT: 16 U/L (ref 7–51)

## 2020-02-22 ENCOUNTER — Ambulatory Visit (AMBULATORY_SURGERY_CENTER): Payer: 59 | Admitting: Gastroenterology

## 2020-02-22 ENCOUNTER — Encounter: Payer: Self-pay | Admitting: Gastroenterology

## 2020-02-22 ENCOUNTER — Other Ambulatory Visit: Payer: Self-pay

## 2020-02-22 ENCOUNTER — Telehealth: Payer: Self-pay | Admitting: Gastroenterology

## 2020-02-22 VITALS — BP 131/87 | HR 72 | Temp 97.3°F | Resp 19 | Ht 62.0 in | Wt 127.0 lb

## 2020-02-22 DIAGNOSIS — K449 Diaphragmatic hernia without obstruction or gangrene: Secondary | ICD-10-CM | POA: Diagnosis not present

## 2020-02-22 DIAGNOSIS — K219 Gastro-esophageal reflux disease without esophagitis: Secondary | ICD-10-CM | POA: Diagnosis not present

## 2020-02-22 DIAGNOSIS — K222 Esophageal obstruction: Secondary | ICD-10-CM | POA: Diagnosis not present

## 2020-02-22 DIAGNOSIS — R1310 Dysphagia, unspecified: Secondary | ICD-10-CM

## 2020-02-22 MED ORDER — PANTOPRAZOLE SODIUM 40 MG PO TBEC: 40.0000 mg | DELAYED_RELEASE_TABLET | Freq: Two times a day (BID) | ORAL | 11 refills | Status: DC

## 2020-02-22 MED ORDER — SODIUM CHLORIDE 0.9 % IV SOLN: 500.0000 mL | Freq: Once | INTRAVENOUS | Status: DC

## 2020-02-22 NOTE — Progress Notes (Signed)
Medical history reviewed with no changes noted. VS assessed by C.W 

## 2020-02-22 NOTE — Progress Notes (Signed)
Called to room to assist during endoscopic procedure.  Patient ID and intended procedure confirmed with present staff. Received instructions for my participation in the procedure from the performing physician.  

## 2020-02-22 NOTE — Telephone Encounter (Signed)
Pt has questions regarding appt. She is scheduled for a barium swallow 02/26/20 @11am  at [facility] and a modified barium swallow at [facility] 02/26/20 at 11:30am. Pt is confused, Appt scheduled from office visit 02/20/20. Please confirm whice appt pt needs.ever

## 2020-02-22 NOTE — Progress Notes (Signed)
0935 Robinul 0.1 mg IV given due large amount of secretions upon assessment.  MD made aware, vss 

## 2020-02-22 NOTE — Addendum Note (Signed)
Addended by: Deon Pilling J on: 02/22/2020 02:39 PM   Modules accepted: Orders

## 2020-02-22 NOTE — Progress Notes (Signed)
Report given to PACU, vss 

## 2020-02-22 NOTE — Op Note (Addendum)
[facility] Patient Name: Jasmine Lee Procedure Date: 02/22/2020 9:29 AM MRN: [id number] Endoscopist: Tressia Danas MD, MD Age: 64 Referring MD:  Date of Birth: 05-Mar-1955 Gender: Female Account #: [id number] Procedure:                Upper GI endoscopy Indications:              Dysphagia, Odynophagia, Esophageal reflux symptoms                            previously controlled on therapy Medicines:                Monitored Anesthesia Care Procedure:                Pre-Anesthesia Assessment:                           - Prior to the procedure, a History and Physical                            was performed, and patient medications and                            allergies were reviewed. The patient's tolerance of                            previous anesthesia was also reviewed. The risks                            and benefits of the procedure and the sedation                            options and risks were discussed with the patient.                            All questions were answered, and informed consent                            was obtained. Prior Anticoagulants: The patient has                            taken no previous anticoagulant or antiplatelet                            agents. ASA Grade Assessment: II - A patient with                            mild systemic disease. After reviewing the risks                            and benefits, the patient was deemed in                            satisfactory condition to undergo the procedure.  After obtaining informed consent, the endoscope was                            passed under direct vision. Throughout the                            procedure, the patient's blood pressure, pulse, and                            oxygen saturations were monitored continuously. The                            Endoscope was introduced through the mouth, and                            advanced to the  third part of duodenum. The upper                            GI endoscopy was accomplished without difficulty.                            The patient tolerated the procedure well. Scope In: Scope Out: Findings:                 A widely patent Schatzki ring was found in the                            lower third of the esophagus. A TTS dilator was                            passed through the scope. Dilation with an 18-19-20                            mm balloon dilator was performed to 20 mm. The                            dilation site was examined and showed mild mucosal                            disruption at 67mm. Estimated blood loss was                            minimal. Biopsies were taken from the mid/proximal                            and distal esophagus with a cold forceps for                            histology. The distal esophageal biopsies further                            distrupted the ring. Estimated blood loss was  minimal.                           Patchy mildly erythematous mucosa was found in the                            gastric body. Biopsies were taken from the antrum,                            body, and fundus with a cold forceps for histology.                            Estimated blood loss was minimal.                           A small hiatal hernia was present.                           The examined duodenum was normal.                           The cardia and gastric fundus were normal on                            retroflexion.                           The exam was otherwise without abnormality. Complications:            No immediate complications. Estimated blood loss:                            Minimal. Estimated Blood Loss:     Estimated blood loss was minimal. Impression:               - Widely patent Schatzki ring. Dilated. Biopsied.                           - Erythematous mucosa in the gastric body. Biopsied.                            - Small hiatal hernia.                           - Normal examined duodenum.                           - The examination was otherwise normal. Recommendation:           - Patient has a contact number available for                            emergencies. The signs and symptoms of potential                            delayed complications were discussed with the  patient. Return to normal activities tomorrow.                            Written discharge instructions were provided to the                            patient.                           - Clear liquid diet today. Resume regular diet                            tomorrow.                           - Continue present medications including famotidine                            20 mg twice daily.                           - Add pantoprazole 40 mg twice daily.                           - Avoid all NSAIDs.                           - Await pathology results.                           - Follow-up in the office in 6-8 weeks, earlier if                            needed Tressia Danas MD, MD 02/22/2020 9:55:36 AM This report has been signed electronically.

## 2020-02-22 NOTE — Patient Instructions (Signed)
Discharge instructions given. Handout on a Dilatation diet and hiatal hernia. Clear liquid diet today tomorrow back to norma. Prescription sent to pharmacy. YOU HAD AN ENDOSCOPIC PROCEDURE TODAY AT THE [facility]:   Refer to the procedure report that was given to you for any specific questions about what was found during the examination.  If the procedure report does not answer your questions, please call your gastroenterologist to clarify.  If you requested that your care partner not be given the details of your procedure findings, then the procedure report has been included in a sealed envelope for you to review at your convenience later.  YOU SHOULD EXPECT: Some feelings of bloating in the abdomen. Passage of more gas than usual.  Walking can help get rid of the air that was put into your GI tract during the procedure and reduce the bloating. If you had a lower endoscopy (such as a colonoscopy or flexible sigmoidoscopy) you may notice spotting of blood in your stool or on the toilet paper. If you underwent a bowel prep for your procedure, you may not have a normal bowel movement for a few days.  Please Note:  You might notice some irritation and congestion in your nose or some drainage.  This is from the oxygen used during your procedure.  There is no need for concern and it should clear up in a day or so.  SYMPTOMS TO REPORT IMMEDIATELY:    Following upper endoscopy (EGD)  Vomiting of blood or coffee ground material  New chest pain or pain under the shoulder blades  Painful or persistently difficult swallowing  New shortness of breath  Fever of 100F or higher  Black, tarry-looking stools  For urgent or emergent issues, a gastroenterologist can be reached at any hour by calling [phone]. Do not use MyChart messaging for urgent concerns.    DIET:  We do recommend a small meal at first, but then you may proceed to your regular diet.  Drink plenty of fluids but you  should avoid alcoholic beverages for 24 hours.  ACTIVITY:  You should plan to take it easy for the rest of today and you should NOT DRIVE or use heavy machinery until tomorrow (because of the sedation medicines used during the test).    FOLLOW UP: Our staff will call the number listed on your records 48-72 hours following your procedure to check on you and address any questions or concerns that you may have regarding the information given to you following your procedure. If we do not reach you, we will leave a message.  We will attempt to reach you two times.  During this call, we will ask if you have developed any symptoms of COVID 19. If you develop any symptoms (ie: fever, flu-like symptoms, shortness of breath, cough etc.) before then, please call [phone].  If you test positive for Covid 19 in the 2 weeks post procedure, please call and report this information to [place].    If any biopsies were taken you will be contacted by phone or by letter within the next 1-3 weeks.  Please call us at [phone] if you have not heard about the biopsies in 3 weeks.    SIGNATURES/CONFIDENTIALITY: You and/or your care partner have signed paperwork which will be entered into your electronic medical record.  These signatures attest to the fact that that the information above on your After Visit Summary has been reviewed and is understood.  Full responsibility of the confidentiality  of this discharge information lies with you and/or your care-partner.

## 2020-02-25 NOTE — Telephone Encounter (Signed)
At the time these appts were scheduled, I was informed BOTH would be at [facility]. I am unable to answer why this change was made for alternate locations. Therefore, a message has been sent to [place] , [address] for further clarification. Once I have received a response, I can call the pt with further updates as to the error and confusion. At the time of this entry, still awaiting response from [facility].

## 2020-02-25 NOTE — Telephone Encounter (Signed)
Spoke with Altamese [place] who confirmed Modified Barium Swallow IS scheduled at [facility]. Asked that the esophagram be rescheduled to [facility] as well. Called Central Scheduling to request location of esophagram be changed to [facility] However, they could not coordinate for that location on 2/1. Discussed with Altamese [place], ST, who provided next available date of 02/28/20 @ 11a, with esophagram to follow at 1130a. Per [facility], they were able to accommodate for this date and time. Therefore, appts have been rescheduled as follows:   Modified Barium Swallow @ [facility] on 02/28/20 @ 11am  Esophagram @ [facility] on 02/28/20 @ 1130am  Made Altamese [place], ST, aware of the rescheduled appts listed above. Verbalized acceptance and understanding. In addition, called pt and informed her about the changes to her appts as listed above, INCLUDING location, date, time and prep for esophagram. Verbalized acceptance and understanding. Final message was sent to Altamese , ST informing her that the pt is aware of the above listed changes as well.   Also called [facility] and spoke with Customer Service rep to add IGM per Dr. Orvan Falconer orders. Advised they do have enough serum to run the test WITHOUT the pt needing an additional lab draw. Pt is aware that we are still awaiting lab results and will call once all has been finalized and reviewed by Dr. Orvan Falconer. Verbalized acceptance and understanding.

## 2020-02-26 ENCOUNTER — Ambulatory Visit (HOSPITAL_COMMUNITY): Payer: 59

## 2020-02-26 ENCOUNTER — Encounter (HOSPITAL_COMMUNITY): Payer: 59

## 2020-02-26 ENCOUNTER — Telehealth: Payer: Self-pay

## 2020-02-26 LAB — INSULIN, FREE AND TOTAL
Free Insulin: 6.2 uU/mL
Total Insulin: 6.2 uU/mL

## 2020-02-26 NOTE — Telephone Encounter (Signed)
  Follow up Call-  Call back number 02/22/2020  Post procedure Call Back phone  # [phone]  Permission to leave phone message Yes  Some recent data might be hidden     Patient questions:  Do you have a fever, pain , or abdominal swelling? Yes.   Pain Score  0 *  Have you tolerated food without any problems? Yes.    Have you been able to return to your normal activities? Yes.    Do you have any questions about your discharge instructions: Diet   No. Medications  No. Follow up visit  No.  Do you have questions or concerns about your Care? No.  Actions: * If pain score is 4 or above: No action needed, pain <4.  1. Have you developed a fever since your procedure? no  2.   Have you had an respiratory symptoms (SOB or cough) since your procedure? no  3.   Have you tested positive for COVID 19 since your procedure no  4.   Have you had any family members/close contacts diagnosed with the COVID 19 since your procedure?  no   If yes to any of these questions please route to Laverna Peace, RN and Karlton Lemon, RN

## 2020-02-27 LAB — TEST AUTHORIZATION

## 2020-02-27 LAB — IGG: IgG (Immunoglobin G), Serum: 1498 mg/dL (ref 600–1540)

## 2020-02-27 LAB — ANTI-SMOOTH MUSCLE ANTIBODY, IGG: Actin (Smooth Muscle) Antibody (IGG): <20 U (ref <20)

## 2020-02-27 LAB — NUCLEOTIDASE, 5', BLOOD: 5-Nucleotidase: 3 U/L (ref 0–10)

## 2020-02-27 LAB — HEPATITIS B CORE ANTIBODY, IGM: Hep B C IgM: NONREACTIVE

## 2020-02-27 LAB — ANA: Anti Nuclear Antibody (ANA): NEGATIVE

## 2020-02-27 LAB — IGM: IgM, Serum: 89 mg/dL (ref 50–300)

## 2020-02-27 LAB — GLUCOSE, FASTING: Glucose, Bld: 106 mg/dL — ABNORMAL HIGH (ref 65–99)

## 2020-02-28 ENCOUNTER — Ambulatory Visit (HOSPITAL_COMMUNITY)
Admission: RE | Admit: 2020-02-28 | Discharge: 2020-02-28 | Disposition: A | Discharge status: Home / Self Care | Payer: 59 | Source: Ambulatory Visit | Attending: Gastroenterology | Admitting: Gastroenterology

## 2020-02-28 ENCOUNTER — Other Ambulatory Visit: Payer: Self-pay

## 2020-02-28 DIAGNOSIS — R748 Abnormal levels of other serum enzymes: Secondary | ICD-10-CM | POA: Diagnosis present

## 2020-02-28 DIAGNOSIS — R1310 Dysphagia, unspecified: Secondary | ICD-10-CM | POA: Insufficient documentation

## 2020-02-29 ENCOUNTER — Ambulatory Visit (HOSPITAL_COMMUNITY)
Admission: RE | Admit: 2020-02-29 | Discharge: 2020-02-29 | Disposition: A | Discharge status: Home / Self Care | Payer: 59 | Source: Ambulatory Visit | Attending: Gastroenterology | Admitting: Gastroenterology

## 2020-02-29 DIAGNOSIS — R1310 Dysphagia, unspecified: Secondary | ICD-10-CM

## 2020-02-29 DIAGNOSIS — R748 Abnormal levels of other serum enzymes: Secondary | ICD-10-CM

## 2020-03-05 ENCOUNTER — Other Ambulatory Visit: Payer: Self-pay

## 2020-03-05 ENCOUNTER — Ambulatory Visit (INDEPENDENT_AMBULATORY_CARE_PROVIDER_SITE_OTHER): Payer: 59 | Admitting: Family Medicine

## 2020-03-05 VITALS — BP 108/74 | HR 66 | Temp 98.4°F | Resp 18 | Wt 125.2 lb

## 2020-03-05 DIAGNOSIS — M542 Cervicalgia: Secondary | ICD-10-CM | POA: Diagnosis not present

## 2020-03-05 DIAGNOSIS — R0982 Postnasal drip: Secondary | ICD-10-CM

## 2020-03-05 DIAGNOSIS — M1990 Unspecified osteoarthritis, unspecified site: Secondary | ICD-10-CM | POA: Diagnosis not present

## 2020-03-05 DIAGNOSIS — K219 Gastro-esophageal reflux disease without esophagitis: Secondary | ICD-10-CM

## 2020-03-05 DIAGNOSIS — I10 Essential (primary) hypertension: Secondary | ICD-10-CM

## 2020-03-05 MED ORDER — IPRATROPIUM BROMIDE 0.06 % NA SOLN: 2.0000 | Freq: Four times a day (QID) | NASAL | 5 refills | Status: DC

## 2020-03-05 MED ORDER — LOSARTAN POTASSIUM-HCTZ 50-12.5 MG PO TABS: 1.0000 | ORAL_TABLET | Freq: Every day | ORAL | 1 refills | Status: DC

## 2020-03-05 MED ORDER — FAMOTIDINE 20 MG PO TABS: 20.0000 mg | ORAL_TABLET | Freq: Two times a day (BID) | ORAL | 3 refills | Status: DC

## 2020-03-05 MED ORDER — CETIRIZINE HCL 10 MG PO TABS
10.0000 mg | ORAL_TABLET | Freq: Every day | ORAL | 3 refills | Status: DC
Start: 2020-03-05 — End: 2021-02-26

## 2020-03-05 NOTE — Progress Notes (Unsigned)
This visit occurred during the SARS-CoV-2 public health emergency.  Safety protocols were in place, including screening questions prior to the visit, additional usage of staff PPE, and extensive cleaning of exam room while observing appropriate contact time as indicated for disinfecting solutions.    Jasmine Lee , [birth year], 65 y.o., female MRN: [id number] Patient Care Team    Relationship Specialty Notifications Start End  Natalia Leatherwood, DO PCP - General Family Medicine  11/17/18   Vernia Buff, MD Referring Physician   04/26/19   Powers, Despina Pole, DPM Referring Physician Surgery  04/26/19   Shamleffer, Konrad Dolores, MD Consulting Physician Endocrinology  11/16/19     Chief Complaint  Patient presents with  . Medication Refill    Referral for ENT for postnasal drip. Pt pulled a muscle in her neck. Has arthritis that is giving her some troubles.      Subjective: Pt presents for an OV for [facility] with multiple acute concerns.   Hypertension: Pt reports compliance with losartan-HCTZ 50-12.5 mg.. Blood pressures ranges at home within normal limits. Patient denies chest pain, shortness of breath or lower extremity edema. Pt takes a  daily baby ASA. Pt is not prescribed statin.   Osteoarthritis, unspecified osteoarthritis type, unspecified site Patient feels her arthritis is flaring.  She has a history of GERD and does not tolerate NSAIDs very well.  She reports her left elbow and bilateral carpometacarpal joints are the most painful.  She has not tried any topicals to help with discomfort.  Neck pain Patient reports another new problem of neck pain of 2 months or more duration.  She denies any change in activity or injury surrounding the time of onset of pain.  She has taken nothing over-the-counter for pain.  She is attempted some stretches but does not feel they were helpful.  The pain is mostly on the right side of her neck and upper shoulder.  She states it hurts to turn  her head to the right.  There is no radiation of pain.  She has had no prior injury or surgeries to her shoulder or neck.  Postnasal drip Presents with new problem of postnasal drip.  She is taking Claritin daily.  She states this used to work well for her but over the last few months she has had increased in her postnasal drip symptoms.  She has not tried changing antihistamines or starting nasal sprays.  She states she has difficulty with nasal spray secondary to her septal deviation.  And she is wondering if she needs referral to ENT for her postnasal drip.  GERD without esophagitis Patient is prescribed Protonix 40 mg twice daily by Dr. Olam Idler.  She was told also to continue the Pepcid 20 mg twice daily.  She needs refills on this today.  She reports her symptoms are currently well controlled.   Depression screen [facility] 2/9 04/24/2019 11/17/2018  Decreased Interest 0 0  Down, Depressed, Hopeless 1 0  PHQ - 2 Score 1 0    Allergies  Allergen Reactions  . Oxycodone Other (See Comments)    hallucination  . Heparin Other (See Comments)    hallucination   Social History   Social History Narrative   Marital status/children/pets: Married.  No children.   Education/employment: Associates degree, retired Counsellor.   Safety:      -smoke alarm in the home:Yes     - wears seatbelt: Yes     - Feels safe in their relationships:  Yes   Past Medical History:  Diagnosis Date  . Allergy   . Arthritis   . Chicken pox   . Endometriosis   . GERD (gastroesophageal reflux disease)   . Heartburn   . History of fainting spells of unknown cause   . History of sun-damaged skin 04/26/2014   Actinic keratosis.  Solar elastosis.  Benign nevus of right posterior neck.  Marland Kitchen HTN (hypertension)   . Muscle weakness 01/2017   Patient reports being hospitalized for bilateral upper extremity weakness with negative stroke work-up.  Negative EEG.  Self resolved  . Osteomyelitis (HCC) 12/2016   left  ankle/foot. Patient had multiple surgeries and treated with antibiotics.  . Seasonal allergies   . Toxic metabolic encephalopathy 01/2017   Extensive negative work-up reported by PCP records.  Thought to be secondary to IV antibiotics for osteomyelitis.   Past Surgical History:  Procedure Laterality Date  . BUNIONECTOMY  10/2016   Patient reports multiple postsurgical infections October/November and December 2018.  Marland Kitchen COLONOSCOPY  03/31/2012   Nonbleeding internal hemorrhoids otherwise normal exam.  Repeat in 10 years.  Marland Kitchen HERNIA REPAIR  1962  . LAPAROSCOPIC ENDOMETRIOSIS FULGURATION  1986  . RHINOPLASTY     Completed for deviated septum.  . WISDOM TOOTH EXTRACTION     Family History  Problem Relation Age of Onset  . Arthritis Mother   . Hearing loss Mother   . Heart attack Mother   . Miscarriages / [place] Mother   . Hyperlipidemia Mother   . Breast cancer Mother 35  . Alcohol abuse Father   . Diabetes Father   . Hypertension Father   . Stroke Father   . Diabetes Sister   . Early death Sister   . Heart disease Sister   . Stroke Sister   . Arthritis Maternal Grandmother   . Hypertension Maternal Grandmother   . Diabetes Maternal Grandfather   . Early death Maternal Grandfather   . Hyperlipidemia Maternal Grandfather   . Hypertension Maternal Grandfather   . Heart attack Maternal Grandfather   . Heart attack Paternal Grandmother   . Early death Paternal Grandfather   . Heart attack Paternal Grandfather   . Depression Sister   . Ovarian cancer Maternal Aunt   . Colon cancer Neg Hx    Allergies as of 03/05/2020      Reactions   Oxycodone Other (See Comments)   hallucination   Heparin Other (See Comments)   hallucination      Medication List       Accurate as of March 05, 2020 11:59 PM. If you have any questions, ask your nurse or doctor.        STOP taking these medications   co-enzyme Q-10 30 MG capsule Stopped by: Felix Pacini, DO   loratadine 10 MG  tablet Commonly known as: CLARITIN Stopped by: Felix Pacini, DO   MILK THISTLE PO Stopped by: Felix Pacini, DO   triamcinolone ointment 0.1 % Commonly known as: KENALOG Stopped by: Felix Pacini, DO   vitamin C 250 MG tablet Commonly known as: ASCORBIC ACID Stopped by: Felix Pacini, DO     TAKE these medications   aspirin EC 81 MG tablet Take 81 mg by mouth daily.   Calcium 250 MG Caps Take 2 capsules by mouth daily.   cetirizine 10 MG tablet Commonly known as: ZYRTEC Take 1 tablet (10 mg total) by mouth at bedtime. Started by: Felix Pacini, DO   clobetasol 0.05 % external solution Commonly  known as: TEMOVATE Apply topically.   famotidine 20 MG tablet Commonly known as: PEPCID Take 1 tablet (20 mg total) by mouth 2 (two) times daily.   Fish Oil 1000 MG Caps Take by mouth.   ipratropium 0.06 % nasal spray Commonly known as: ATROVENT Place 2 sprays into both nostrils 4 (four) times daily. Started by: Felix Pacini, DO   losartan-hydrochlorothiazide 50-12.5 MG tablet Commonly known as: HYZAAR Take 1 tablet by mouth daily.   MULTIVITAMIN ADULT PO Take by mouth.   pantoprazole 40 MG tablet Commonly known as: PROTONIX Take 1 tablet (40 mg total) by mouth 2 (two) times daily.   PROBIOTIC ADVANCED PO Take by mouth daily.   VITAMIN D PO Take 2 each by mouth. gummies       All past medical history, surgical history, allergies, family history, immunizations andmedications were updated in the EMR today and reviewed under the history and medication portions of their EMR.     ROS: Negative, with the exception of above mentioned in HPI   Objective:  BP 108/74 (BP Location: Left Arm, Patient Position: Sitting, Cuff Size: Normal)   Pulse 66   Temp 98.4 F (36.9 C) (Oral)   Resp 18   Wt 125 lb 3.2 oz (56.8 kg)   SpO2 96%   BMI 22.90 kg/m  Body mass index is 22.9 kg/m. Gen: Afebrile. No acute distress. Nontoxic in appearance, well developed, well nourished.   HENT: AT. . Bilateral TM visualized without erythema or effusions.  External auditory canals are normal bilaterally.. MMM, no oral lesions. Bilateral nares without erythema or redness. Throat without erythema or exudates.  Cough.  No hoarseness.  No obvious postnasal drip. Eyes:Pupils Equal Round Reactive to light, Extraocular movements intact,  Conjunctiva without redness, discharge or icterus. Neck/lymp/endocrine: Supple, no lymphadenopathy CV: RRR no murmur, no edema Chest: CTAB, no wheeze or crackles. Good air movement, normal resp effort.  MSK: Cervical spine with decreased range of motion right rotation and left and right side bending.  No bony tenderness.  Ropiness right cervical paraspinal and right upper/middle trap.  Neurovascular intact distally Skin: No rashes, purpura or petechiae.  Neuro:  Normal gait. PERLA. EOMi. Alert. Oriented x3  Psych: Normal affect, dress and demeanor. Normal speech. Normal thought content and judgment.  No exam data present No results found. No results found for this or any previous visit (from the past 24 hour(s)).  Assessment/Plan: Jasmine Lee is a 65 y.o. female present for OV for  Hypertension: -Stable Continue losartan-HCTZ Continue baby aspirin Routine exercise and healthy diet recommended. Follow-up 5.5 months  Osteoarthritis, unspecified osteoarthritis type, unspecified site Gust options with her today.  Would avoid NSAIDs with her history of GERD and elevated liver enzymes Discussed turmeric supplement Discussed Aspercreme and capsaicin for carpometacarpal joints.  Neck pain Discussed options with her today for comfort.   -Encouraged her to try OTC Biofreeze, heating pad and massage for comfort.  - She would like referred to physical therapy> this was placed for her today  Postnasal drip We discussed options today.  And before ENT referral she would benefit from optimizing her antihistamine regimen.  She is in agreement with  this today. Discontinue Claritin Start Zyrtec nightly Start Atrovent nightly, can use 4 times daily if tolerating.  She has difficulties tolerating nasal sprays per her report even nasal saline. Consider adding on Singulair if symptoms are not controlled versus ENT referral at that time.  GERD without esophagitis Continue Protonix 40 mg twice daily-medication  managed by GI Dr. Orvan Falconer. Continue Pepcid 20 mg twice daily-refilled for her today.   Reviewed expectations re: course of current medical issues.  Discussed self-management of symptoms.  Outlined signs and symptoms indicating need for more acute intervention.  Patient verbalized understanding and all questions were answered.  Patient received an After-Visit Summary.    Orders Placed This Encounter  Procedures  . Ambulatory referral to Physical Therapy   Meds ordered this encounter  Medications  . losartan-hydrochlorothiazide (HYZAAR) 50-12.5 MG tablet    Sig: Take 1 tablet by mouth daily.    Dispense:  90 tablet    Refill:  1  . famotidine (PEPCID) 20 MG tablet    Sig: Take 1 tablet (20 mg total) by mouth 2 (two) times daily.    Dispense:  90 tablet    Refill:  3  . cetirizine (ZYRTEC) 10 MG tablet    Sig: Take 1 tablet (10 mg total) by mouth at bedtime.    Dispense:  90 tablet    Refill:  3  . ipratropium (ATROVENT) 0.06 % nasal spray    Sig: Place 2 sprays into both nostrils 4 (four) times daily.    Dispense:  15 mL    Refill:  5    Referral Orders     Ambulatory referral to Physical Therapy   Note is dictated utilizing voice recognition software. Although note has been proof read prior to signing, occasional typographical errors still can be missed. If any questions arise, please do not hesitate to call for verification.   electronically signed by:  Felix Pacini, DO  [facility] [place]

## 2020-03-05 NOTE — Patient Instructions (Signed)
I will refer you to physical therapy for your neck.  Heat padding, biofreeze gel and massages will be helpful also.  For your hands and elbow - use Aspercreme or capsicin cream  I have refilled your meds  Stop claritin and start zyrtec before bed.  Atrovent nasal spray also prescribed to help decrease drainage if you are able to tolerate it.   Next appt mid July for physical.    Cervical Sprain A cervical sprain is also called a neck sprain. It is a stretch or tear in one or more ligaments in the neck. Ligaments are tissues that connect bones to each other. Neck sprains can be mild, bad, or very bad. A very bad sprain in the neck can cause the bones in the neck to be unstable. This can damage the spinal cord. It can also cause serious problems in the brain, spinal cord, and nerves (nervous system). Most neck sprains heal in 4-6 weeks. It can take more or less time depending on:  What caused the injury.  The amount of injury. What are the causes? Neck sprains may be caused by trauma, such as:  An injury from an accident in a vehicle such as a car or boat.  A fall.  The head and neck being moved front to back or side to side all of a sudden (whiplash injury). Mild neck sprains may be caused by wear and tear over time. What increases the risk? The following factors may make you more likely to develop this condition:  Taking part in activities that put you at high risk of hurting your neck. These include: ? Contact sports. ? Actor. ? Gymnastics. ? Diving.  Taking risks when driving or riding in a vehicle such as a car or boat.  Arthritis caused by wear and tear of the joints in the spine.  The neck not being very strong or flexible.  Having had a neck injury in the past.  Poor posture.  Spending a lot of time in certain positions that put stress on the neck. This may be from sitting at a computer for a long time. What are the signs or symptoms? Symptoms of this  condition include:  Your neck, shoulders, or upper back feeling: ? Painful or sore. ? Stiff. ? Tender. ? Swollen. ? Hot, or like it is burning.  Sudden tightening of neck muscles (spasms).  Not being able to move the neck very much.  Headache.  Feeling dizzy.  Feeling like you may vomit, or vomiting.  Having a hand or arm that: ? Feels weak. ? Loses feeling (feels numb). ? Tingles. You may get symptoms right away after injury, or you may get them over a few days. In some cases, symptoms may go away with treatment and come back over time. How is this treated? This condition is treated by:  Resting your neck.  Icing the part of your neck that is hurt.  Doing exercises to restore movement and strength to your neck (physical therapy). If there is no swelling, you may use heat therapy 2-3 days after the injury took place. If your injury is very bad, treatment may also include:  Keeping your neck in place for a length of time. This may be done using: ? A neck collar. This supports your chin and the back of your head. ? A cervical traction device. This is a sling that holds up your head. The sling removes weight and pressure from your neck. It may also help  to relieve pain.  Medicines that help with: ? Pain. ? Irritation and swelling (inflammation).  Medicines that help to relax your muscles (muscle relaxants).  Surgery. This is rare. Follow these instructions at home: Medicines  Take over-the-counter and prescription medicines only as told by your doctor.  Ask your doctor if the medicine prescribed to you: ? Requires you to avoid driving or using heavy machinery. ? Can cause trouble pooping (constipation). You may need to take these actions to prevent or treat trouble pooping:  Drink enough fluid to keep your pee (urine) pale yellow.  Take over-the-counter or prescription medicines.  Eat foods that are high in fiber. These include beans, whole grains, and fresh  fruits and vegetables.  Limit foods that are high in fat and sugar. These include fried or sweet foods.   If you have a neck collar:  Wear it as told by your doctor. Do not take it off unless told.  Ask your doctor before adjusting your collar.  If you have long hair, keep it outside of the collar.  Ask your doctor if you may take off the collar for cleaning and bathing. If you may take off the collar: ? Follow instructions about how to take it off safely. ? Clean it by hand with mild soap and water. Let it air-dry fully. ? If your collar has pads that you can take out:  Take the pads out every 1-2 days.  Wash them by hand with soap and water.  Let the pads air-dry fully before you put them back in the collar. ? Tell your doctor if your skin under the collar has irritation or sores. Managing pain, stiffness, and swelling  Use a cervical traction device, if told by your doctor.  If told, put ice on the affected area. To do this: ? Put ice in a plastic bag. ? Place a towel between your skin and the bag. ? Leave the ice on for 20 minutes, 2-3 times a day.  If told, put heat on the affected area. Do this before exercise or as often as told by your doctor. Use the heat source that your doctor recommends, such as a moist heat pack or a heating pad. ? Place a towel between your skin and the heat source. ? Leave the heat on for 20-30 minutes. ? Take the heat off if your skin turns bright red. This is very important if you cannot feel pain, heat, or cold. You may have a greater risk of getting burned.      Activity  Do not drive while wearing a neck collar. If you do not have a neck collar, ask if it is safe to drive while your neck heals.  Do not lift anything that is heavier than 10 lb (4.5 kg), or the limit that you are told, until your doctor tells you that it is safe.  Rest as told by your doctor.  Do exercises as told by your doctor or physical therapist.  Return to your  normal activities as told by your doctor. Avoid positions and activities that make you feel worse. Ask your doctor what activities are safe for you. General instructions  Do not use any products that contain nicotine or tobacco, such as cigarettes, e-cigarettes, and chewing tobacco. These can delay healing. If you need help quitting, ask your doctor.  Keep all follow-up visits as told by your doctor or physical therapist. This is important. How is this prevented? To prevent a neck sprain from  happening again:  Practice good posture. Adjust your workstation to help you do this.  Exercise regularly as told by your doctor or physical therapist.  Avoid activities that are risky or may cause a neck sprain. Contact a doctor if:  Your symptoms get worse.  Your symptoms do not get better after 2 weeks of treatment.  Your pain gets worse.  Medicine does not help your pain.  You have new symptoms that you cannot explain.  Your neck collar gives you sores on your skin or bothers your skin. Get help right away if:  You have very bad pain.  You get any of the following in any part of your body: ? Loss of feeling. ? Tingling. ? Weakness.  You cannot move a part of your body.  You have neck pain and either of these: ? Very bad dizziness. ? A very bad headache. Summary  A cervical sprain is also called a neck sprain. It is a stretch or tear in one or more ligaments in the neck. Ligaments are tissues that connect bones.  Neck sprains may be caused by trauma, such as an injury or a fall.  You may get symptoms right away after injury, or you may get them over a few days.  Neck sprains may be treated with rest, heat, ice, medicines, exercise, and surgery. This information is not intended to replace advice given to you by your health care provider. Make sure you discuss any questions you have with your health care provider. Document Revised: 09/20/2018 Document Reviewed:  09/20/2018 Elsevier Patient Education  2021 [employer].

## 2020-03-06 ENCOUNTER — Encounter: Payer: Self-pay | Admitting: Family Medicine

## 2020-04-15 ENCOUNTER — Other Ambulatory Visit: Payer: Self-pay

## 2020-04-15 ENCOUNTER — Encounter: Payer: Self-pay | Admitting: Gastroenterology

## 2020-04-15 ENCOUNTER — Ambulatory Visit (INDEPENDENT_AMBULATORY_CARE_PROVIDER_SITE_OTHER): Payer: 59 | Admitting: Gastroenterology

## 2020-04-15 VITALS — BP 120/78 | HR 68 | Ht 62.0 in | Wt 127.5 lb

## 2020-04-15 DIAGNOSIS — R748 Abnormal levels of other serum enzymes: Secondary | ICD-10-CM

## 2020-04-15 DIAGNOSIS — R1310 Dysphagia, unspecified: Secondary | ICD-10-CM | POA: Diagnosis not present

## 2020-04-15 DIAGNOSIS — T17308D Unspecified foreign body in larynx causing other injury, subsequent encounter: Secondary | ICD-10-CM

## 2020-04-15 NOTE — Progress Notes (Signed)
Referring Provider: Ma Hillock, DO Primary Care Physician:  Ma Hillock, DO  Chief complaint: Elevated alk phos, dysphagia   IMPRESSION:  Elevated alk phos with normal GGTP: No stigmata of liver disease by history, labs, or physical exam. Alk phos isoenzymes show a slight increase in liver with a nearly preserved ratio of liver:bone.  Additional evaluation with labs and imaging has been non-diagnostic. I have recommending all hepatoxic drugs. Consider MRI/MRCP +/- liver biopsy if the alk phos increased.   GERD controlled with medications: Continue Pepcid and Protonix.   Dysphagia: No source identified on endoscopy or esophagram, but, improved with empiric dilation.   Choking/Cough: Not improving despite reflux treatment. I initially thought her symptoms were related to oropharyngeal dysphagia. But, evaluation was negative. She requested a follow-up appointment with Dr. Raoul Pitch   Colonoscopy cancer screening: Colonoscopy from 2014 reviewed. Colonoscopy will be due 2024.   PLAN: - Continue pantoprazole 40 mg BID and famotidine 20 mg BID - MRI/MRCP +/- Liver biopsy if serial alk phos levels are increasing - Surveillance colonoscopy 2024 - Follow-up PRN  Please see the "Patient Instructions" section for addition details about the plan.  HPI: Jasmine Lee is a 65 y.o. female initially referred by Dr. Raoul Pitch for an elevated alkaline phosphatase. Seen in consultation 02/20/20. Returns in follow-up. The interval history is obtained through the patient and review of her electronic health record.  She has a history of endometriosis, arthritis, and hypertension.  She had a septorhinoplasty for a deviated septum.  Found to have an elevated alk phos 03/2019 that has been trending up. GGT was normal. Evaluated by endocrinology who do not think she had underlying bone disease.   Alk phos isoenzymes 11/09/2019 showed 88% liver, 12% bone, although this is essentially normal distribution of alk  phos.  No prior knowledge of elevated liver enzymes or liver disease. No pruritus, jaundice, acholic stools, or rash. She takes multiple supplements including milk thistle.  Bone density 06/2019 showed low bone density.  Labs 11/09/2019 show a sodium of 132, creatinine 0.85, total bilirubin 0.5, AST 24, ALT 17, alk phos 150, vitamin D 54, TSH 1.95  Although referred for the elevated alk phos, she expressed concerns about a one year history reflux and dysphagia. She has longstanding heartburn that has been controlled on Pepcid 20 mg BID most days x 1 year with Tums PRN for breakthrough symptoms. Has frequent choking when she swallows - occurs with both liquids and solids. Frequent odynophagia with solids. She feels like she is swallowing wrong.  Is on Claritin for nasal drainage.  Weight is stable, although she is trying to lose weight.   Evaluation since her last visit includes: - Upper endoscopy 02/22/2020: Widely patent Schatzki's ring dilated with an 18 to 20 mm TTS balloon.  Gastritis.  Small hiatal hernia.  Normal gastric biopsies, normal esophageal biopsies.  She was continued on famotidine 20 mg twice daily with the addition of pantoprazole 40 mg twice daily. - Abdominal ultrasound 02/29/2020 showed cholelithiasis without evidence of cholecystitis and an echogenic liver - Modified barium swallow 02/28/2020 was normal - Esophagram 02/28/2020 showed normal esophageal motility and a small sliding-type hiatal hernia.  There was no esophageal mass, stricture, or reflux. - Labs 02/21/2020 showed a normal IgM, ANA, AST, ALT.  Alk phos was elevated at 140.  Hep B core antibody negative, F-actin negative, IgG normal, 5 prime nucleotidase normal, glucose 106, insulin 6.2, GGT 16. Calculated HOMA IR of 1.6.   Feeling better  but continues to have difficulty with choking. Finds that she gets choked even at rest. Sometimes has choking when eating. Notes that she has severe post-nasal drip. Dr. Raoul Pitch recommended  Zyrtec  She feels like her dysphagia has improved after empiric dilation.   Endoscopic history: - Colonoscopy report from her procedure with Dr. Ileana Roup [facility] 03/31/2012. The procedure was performed to the cecum.  The bowel prep was excellent.  Grade 1 internal hemorrhoids were seen.  The exam was otherwise normal.  Repeat colonoscopy recommended in 10 years. - Upper endoscopy 02/22/2020: Widely patent Schatzki's ring dilated with an 18 to 20 mm TTS balloon.  Gastritis.  Small hiatal hernia.  Normal gastric biopsies, normal esophageal biopsies.  She was continued on famotidine 20 mg twice daily with the addition of pantoprazole 40 mg twice daily.   Past Medical History:  Diagnosis Date   Allergy    Arthritis    Chicken pox    Endometriosis    GERD (gastroesophageal reflux disease)    Heartburn    History of fainting spells of unknown cause    History of sun-damaged skin 04/26/2014   Actinic keratosis.  Solar elastosis.  Benign nevus of right posterior neck.   HTN (hypertension)    Muscle weakness 01/2017   Patient reports being hospitalized for bilateral upper extremity weakness with negative stroke work-up.  Negative EEG.  Self resolved   Osteomyelitis ([place]) 12/2016   left ankle/foot. Patient had multiple surgeries and treated with antibiotics.   Seasonal allergies    Toxic metabolic encephalopathy 70/9628   Extensive negative work-up reported by PCP records.  Thought to be secondary to IV antibiotics for osteomyelitis.    Past Surgical History:  Procedure Laterality Date   BUNIONECTOMY  10/2016   Patient reports multiple postsurgical infections October/November and December 2018.   COLONOSCOPY  03/31/2012   Nonbleeding internal hemorrhoids otherwise normal exam.  Repeat in 10 years.   HERNIA REPAIR  1962   LAPAROSCOPIC ENDOMETRIOSIS FULGURATION  1986   RHINOPLASTY     Completed for deviated septum.   WISDOM TOOTH EXTRACTION       Current Outpatient Medications  Medication Sig Dispense Refill   aspirin EC 81 MG tablet Take 81 mg by mouth daily.     Calcium 250 MG CAPS Take 1 capsule by mouth daily.     cetirizine (ZYRTEC) 10 MG tablet Take 1 tablet (10 mg total) by mouth at bedtime. 90 tablet 3   clobetasol (TEMOVATE) 0.05 % external solution Apply topically.     famotidine (PEPCID) 20 MG tablet Take 1 tablet (20 mg total) by mouth 2 (two) times daily. 90 tablet 3   ipratropium (ATROVENT) 0.06 % nasal spray Place 2 sprays into both nostrils 4 (four) times daily. 15 mL 5   losartan-hydrochlorothiazide (HYZAAR) 50-12.5 MG tablet Take 1 tablet by mouth daily. 90 tablet 1   Multiple Vitamins-Minerals (MULTIVITAMIN ADULT PO) Take 1 tablet by mouth daily.     Omega-3 Fatty Acids (FISH OIL) 1000 MG CAPS Take 1 capsule by mouth daily at 2 PM.     pantoprazole (PROTONIX) 40 MG tablet Take 1 tablet (40 mg total) by mouth 2 (two) times daily. 60 tablet 11   Probiotic Product (PROBIOTIC ADVANCED PO) Take by mouth daily.     VITAMIN D PO Take 2 each by mouth. gummies     No current facility-administered medications for this visit.    Allergies as of 04/15/2020 - Review Complete  04/15/2020  Allergen Reaction Noted   Oxycodone Other (See Comments) 04/24/2019   Heparin Other (See Comments) 02/20/2020    Family History  Problem Relation Age of Onset   Arthritis Mother    Hearing loss Mother    Heart attack Mother    Miscarriages / [place] Mother    Hyperlipidemia Mother    Breast cancer Mother 68   Alcohol abuse Father    Diabetes Father    Hypertension Father    Stroke Father    Diabetes Sister    Early death Sister    Heart disease Sister    Stroke Sister    Arthritis Maternal Grandmother    Hypertension Maternal Grandmother    Diabetes Maternal Grandfather    Early death Maternal Grandfather    Hyperlipidemia Maternal Grandfather    Hypertension Maternal Grandfather     Heart attack Maternal Grandfather    Heart attack Paternal Grandmother    Early death Paternal Grandfather    Heart attack Paternal Grandfather    Depression Sister    Ovarian cancer Maternal Aunt    Colon cancer Neg Hx       Physical Exam: General:   Alert, in NAD. No scleral icterus. No bilateral temporal wasting.  No xanthelasma. Heart:  Regular rate and rhythm; no murmurs Pulm: Clear anteriorly; no wheezing Abdomen:  Soft. Nontender. Nondistended. Normal bowel sounds. No rebound or guarding. No fluid wave.  No hepatosplenomegaly. LAD: No inguinal or umbilical LAD Extremities:  Without edema. Neurologic:  Alert and  oriented x4;  grossly normal neurologically; no asterixis or clonus. Skin: No jaundice. No palmar erythema or spider angioma.  No Terry's nails. Psych:  Alert and cooperative. Normal mood and affect.    Jeshua Ransford L. Tarri Glenn, MD, MPH 04/15/2020, 2:23 PM

## 2020-04-15 NOTE — Patient Instructions (Addendum)
If you are age 64 or younger, your body mass index should be between 19-25. Your Body mass index is 23.32 kg/m. If this is out of the aformentioned range listed, please consider follow up with your Primary Care Provider.   I did not change your medications today.  You should have a colonoscopy in 2024.  I will work with Dr. Claiborne Billings in the meantime. Please contact me with any questions or concerns before that time.   Thank you for entrusting me with your care and choosing [facility].  Dr Orvan Falconer

## 2020-07-04 ENCOUNTER — Other Ambulatory Visit: Payer: Self-pay | Admitting: Family Medicine

## 2020-07-04 DIAGNOSIS — Z1231 Encounter for screening mammogram for malignant neoplasm of breast: Secondary | ICD-10-CM

## 2020-07-07 ENCOUNTER — Other Ambulatory Visit: Payer: Self-pay

## 2020-07-07 ENCOUNTER — Ambulatory Visit
Admission: RE | Admit: 2020-07-07 | Discharge: 2020-07-07 | Disposition: A | Discharge status: Home / Self Care | Payer: 59 | Source: Ambulatory Visit | Attending: Family Medicine | Admitting: Family Medicine

## 2020-07-07 DIAGNOSIS — Z1231 Encounter for screening mammogram for malignant neoplasm of breast: Secondary | ICD-10-CM

## 2020-08-11 ENCOUNTER — Ambulatory Visit (INDEPENDENT_AMBULATORY_CARE_PROVIDER_SITE_OTHER): Payer: 59 | Admitting: Family Medicine

## 2020-08-11 ENCOUNTER — Encounter: Payer: Self-pay | Admitting: Family Medicine

## 2020-08-11 VITALS — BP 108/67 | HR 70 | Temp 98.1°F | Ht 61.5 in | Wt 126.0 lb

## 2020-08-11 DIAGNOSIS — M8580 Other specified disorders of bone density and structure, unspecified site: Secondary | ICD-10-CM | POA: Diagnosis not present

## 2020-08-11 DIAGNOSIS — Z131 Encounter for screening for diabetes mellitus: Secondary | ICD-10-CM

## 2020-08-11 DIAGNOSIS — K219 Gastro-esophageal reflux disease without esophagitis: Secondary | ICD-10-CM | POA: Diagnosis not present

## 2020-08-11 DIAGNOSIS — R0982 Postnasal drip: Secondary | ICD-10-CM

## 2020-08-11 DIAGNOSIS — Z0000 Encounter for general adult medical examination without abnormal findings: Secondary | ICD-10-CM

## 2020-08-11 DIAGNOSIS — I10 Essential (primary) hypertension: Secondary | ICD-10-CM | POA: Diagnosis not present

## 2020-08-11 DIAGNOSIS — R748 Abnormal levels of other serum enzymes: Secondary | ICD-10-CM

## 2020-08-11 LAB — CBC WITH DIFFERENTIAL/PLATELET
Basophils Absolute: 0.1 10*3/uL (ref 0.0–0.1)
Basophils Relative: 0.9 % (ref 0.0–3.0)
Eosinophils Absolute: 0.1 10*3/uL (ref 0.0–0.7)
Eosinophils Relative: 1.9 % (ref 0.0–5.0)
HCT: 39 % (ref 36.0–46.0)
Hemoglobin: 13.2 g/dL (ref 12.0–15.0)
Lymphocytes Relative: 24.3 % (ref 12.0–46.0)
Lymphs Abs: 1.6 10*3/uL (ref 0.7–4.0)
MCHC: 33.9 g/dL (ref 30.0–36.0)
MCV: 94.9 fl (ref 78.0–100.0)
Monocytes Absolute: 0.6 10*3/uL (ref 0.1–1.0)
Monocytes Relative: 9.1 % (ref 3.0–12.0)
Neutro Abs: 4.2 10*3/uL (ref 1.4–7.7)
Neutrophils Relative %: 63.8 % (ref 43.0–77.0)
Platelets: 321 10*3/uL (ref 150.0–400.0)
RBC: 4.11 Mil/uL (ref 3.87–5.11)
RDW: 12.8 % (ref 11.5–15.5)
WBC: 6.6 10*3/uL (ref 4.0–10.5)

## 2020-08-11 LAB — HEMOGLOBIN A1C: Hgb A1c MFr Bld: 5.8 % (ref 4.6–6.5)

## 2020-08-11 LAB — COMPREHENSIVE METABOLIC PANEL
ALT: 11 U/L (ref 0–35)
AST: 17 U/L (ref 0–37)
Albumin: 4.2 g/dL (ref 3.5–5.2)
Alkaline Phosphatase: 107 U/L (ref 39–117)
BUN: 17 mg/dL (ref 6–23)
CO2: 28 mEq/L (ref 19–32)
Calcium: 9.6 mg/dL (ref 8.4–10.5)
Chloride: 100 mEq/L (ref 96–112)
Creatinine, Ser: 0.9 mg/dL (ref 0.40–1.20)
GFR: 67.75 mL/min (ref >60.00)
Glucose, Bld: 97 mg/dL (ref 70–99)
Potassium: 3.9 mEq/L (ref 3.5–5.1)
Sodium: 138 mEq/L (ref 135–145)
Total Bilirubin: 0.6 mg/dL (ref 0.2–1.2)
Total Protein: 7.2 g/dL (ref 6.0–8.3)

## 2020-08-11 LAB — LIPID PANEL
Cholesterol: 233 mg/dL — ABNORMAL HIGH (ref 0–200)
HDL: 88.4 mg/dL (ref >39.00)
LDL Cholesterol: 126 mg/dL — ABNORMAL HIGH (ref 0–99)
NonHDL: 144.73
Total CHOL/HDL Ratio: 3
Triglycerides: 95 mg/dL (ref 0.0–149.0)
VLDL: 19 mg/dL (ref 0.0–40.0)

## 2020-08-11 LAB — TSH: TSH: 1.24 u[IU]/mL (ref 0.35–5.50)

## 2020-08-11 LAB — VITAMIN D 25 HYDROXY (VIT D DEFICIENCY, FRACTURES): VITD: 65.98 ng/mL (ref 30.00–100.00)

## 2020-08-11 MED ORDER — FAMOTIDINE 20 MG PO TABS: 20.0000 mg | ORAL_TABLET | Freq: Two times a day (BID) | ORAL | 3 refills | Status: DC

## 2020-08-11 MED ORDER — IPRATROPIUM BROMIDE 0.06 % NA SOLN: 2.0000 | Freq: Four times a day (QID) | NASAL | 5 refills | Status: DC

## 2020-08-11 MED ORDER — LOSARTAN POTASSIUM-HCTZ 50-12.5 MG PO TABS: 1.0000 | ORAL_TABLET | Freq: Every day | ORAL | 1 refills | Status: DC

## 2020-08-11 NOTE — Progress Notes (Signed)
This visit occurred during the SARS-CoV-2 public health emergency.  Safety protocols were in place, including screening questions prior to the visit, additional usage of staff PPE, and extensive cleaning of exam room while observing appropriate contact time as indicated for disinfecting solutions.    Patient ID: Jasmine Lee, female  DOB: 10/01/1955, 65 y.o.   MRN: [id number] Patient Care Team    Relationship Specialty NotifRocco Sereneications Start End  Natalia LeatherwoodKuneff, Isai Gottlieb A, DO PCP - General Family Medicine  11/17/18   Vernia BuffFeldman, Steven R, MD Referring Physician   04/26/19   Yehuda MaoPowers, Nicholas S, DPM Referring Physician Surgery  04/26/19   Shamleffer, Konrad DoloresIbtehal Jaralla, MD Consulting Physician Endocrinology  11/16/19     Chief Complaint  Patient presents with   Annual Exam    Pt is fasting     Subjective:  Jasmine Lee is a 65 y.o.  Female  present for CPE/CMC. All past medical history, surgical history, allergies, family history, immunizations, medications and social history were updated in the electronic medical record today. All recent labs, ED visits and hospitalizations within the last year were reviewed.  Health maintenance:  Colonoscopy: Colonoscopy completed in 2014.  10-year follow-up per patient. Mammogram: completed: 07/06/2020-[facility] [place], Cervical cancer screening: last pap: 04/2019> GYN Immunizations: tdap UTD 2015, Influenza declined (encouraged yearly), COVID x3 . Shingrix declined-she will think about it and call back > can have by a nurse visit. infectious disease screening: HIV and Hep C completed DEXA: Estrogen deficient.  Bone density 2021- -1.5 Assistive device: none Oxygen FAO:ZHYQuse:none Patient has a Dental home. Hospitalizations/ED visits: reviewed  Hypertension: Pt reports compliance with losartan-HCTZ 50-12.5 mg.. Blood pressures ranges at home within normal limits. Patient denies chest pain, shortness of breath, dizziness or lower extremity edema.   Pt takes a  daily baby ASA. Pt is  not prescribed statin.     Osteoarthritis, unspecified osteoarthritis type, unspecified site Patient feels her arthritis is flaring.  She has a history of GERD and does not tolerate NSAIDs very well.  She reports her left elbow and bilateral carpometacarpal joints are the most painful.  She has not tried any topicals to help with discomfort.    Postnasal drip Pt reports improvement with the addition of Atrovent nasal spray last visit.  Prior note:  Presents with new problem of postnasal drip.  She is taking Claritin daily.  She states this used to work well for her but over the last few months she has had increased in her postnasal drip symptoms.  She has not tried changing antihistamines or starting nasal sprays.  She states she has difficulty with nasal spray secondary to her septal deviation.  And she is wondering if she needs referral to ENT for her postnasal drip.   GERD without esophagitis Patient is prescribed Protonix 40 mg twice daily by Dr. Olam IdlerBeavers-[facility].  She reports compliance with Pepcid 20 mg twice daily.   [facility] 08/11/2020 04/24/2019 11/17/2018  Decreased Interest 0 0 0  Down, Depressed, Hopeless 0 1 0  PHQ - 2 Score 0 1 0   No flowsheet data found.  Immunization History  Administered Date(s) Administered   PFIZER(Purple Top)SARS-COV-2 Vaccination 04/15/2019, 05/08/2019, 12/27/2019   Tdap 01/24/2014   Past Medical History:  Diagnosis Date   Allergy    Arthritis    Chicken pox    Endometriosis    GERD (gastroesophageal reflux disease)    Heartburn    History of fainting spells of unknown cause    History of  sun-damaged skin 04/26/2014   Actinic keratosis.  Solar elastosis.  Benign nevus of right posterior neck.   HTN (hypertension)    Muscle weakness 01/2017   Patient reports being hospitalized for bilateral upper extremity weakness with negative stroke work-up.  Negative EEG.  Self resolved   Osteomyelitis ([facility]) 12/2016   left ankle/foot. Patient  had multiple surgeries and treated with antibiotics.   Seasonal allergies    Toxic metabolic encephalopathy 01/2017   Extensive negative work-up reported by PCP records.  Thought to be secondary to IV antibiotics for osteomyelitis.   Allergies  Allergen Reactions   Oxycodone Other (See Comments)    hallucination   Heparin Other (See Comments)    hallucination   Past Surgical History:  Procedure Laterality Date   BUNIONECTOMY  10/2016   Patient reports multiple postsurgical infections October/November and December 2018.   COLONOSCOPY  03/31/2012   Nonbleeding internal hemorrhoids otherwise normal exam.  Repeat in 10 years.   HERNIA REPAIR  1962   LAPAROSCOPIC ENDOMETRIOSIS FULGURATION  1986   RHINOPLASTY     Completed for deviated septum.   WISDOM TOOTH EXTRACTION     Family History  Problem Relation Age of Onset   Arthritis Mother    Hearing loss Mother    Heart attack Mother    Miscarriages / Stillbirths Mother    Hyperlipidemia Mother    Breast cancer Mother 60   Alcohol abuse Father    Diabetes Father    Hypertension Father    Stroke Father    Diabetes Sister    Early death Sister    Heart disease Sister    Stroke Sister    Arthritis Maternal Grandmother    Hypertension Maternal Grandmother    Diabetes Maternal Grandfather    Early death Maternal Grandfather    Hyperlipidemia Maternal Grandfather    Hypertension Maternal Grandfather    Heart attack Maternal Grandfather    Heart attack Paternal Grandmother    Early death Paternal Grandfather    Heart attack Paternal Grandfather    Depression Sister    Ovarian cancer Maternal Aunt    Colon cancer Neg Hx    Social History   Social History Narrative   Marital status/children/pets: Married.  No children.   Education/employment: Associates degree, retired Counsellor.   Safety:      -smoke alarm in the home:Yes     - wears seatbelt: Yes     - Feels safe in their relationships: Yes    Allergies as of  08/11/2020       Reactions   Oxycodone Other (See Comments)   hallucination   Heparin Other (See Comments)   hallucination        Medication List        Accurate as of August 11, 2020  9:32 AM. If you have any questions, ask your nurse or doctor.          aspirin EC 81 MG tablet Take 81 mg by mouth daily.   Calcium 250 MG Caps Take 1 capsule by mouth daily.   cetirizine 10 MG tablet Commonly known as: ZYRTEC Take 1 tablet (10 mg total) by mouth at bedtime.   clobetasol 0.05 % external solution Commonly known as: TEMOVATE Apply topically.   famotidine 20 MG tablet Commonly known as: PEPCID Take 1 tablet (20 mg total) by mouth 2 (two) times daily.   Fish Oil 1000 MG Caps Take 1 capsule by mouth daily at 2 PM.   ipratropium 0.06 %  nasal spray Commonly known as: ATROVENT Place 2 sprays into both nostrils 4 (four) times daily.   losartan-hydrochlorothiazide 50-12.5 MG tablet Commonly known as: HYZAAR Take 1 tablet by mouth daily.   MULTIVITAMIN ADULT PO Take 1 tablet by mouth daily.   pantoprazole 40 MG tablet Commonly known as: PROTONIX Take 1 tablet (40 mg total) by mouth 2 (two) times daily.   PROBIOTIC ADVANCED PO Take by mouth daily.   VITAMIN D PO Take 2 each by mouth. gummies        All past medical history, surgical history, allergies, family history, immunizations andmedications were updated in the EMR today and reviewed under the history and medication portions of their EMR.     No results found for this or any previous visit (from the past 2160 hour(s)).  MM 3D SCREEN BREAST BILATERAL Result Date: 07/09/2020 iMPRESSION: No mammographic evidence of malignancy. A result letter of this screening mammogram will be mailed directly to the patient. RECOMMENDATION: Screening mammogram in one year. (Code:SM-B-01Y) BI-RADS CATEGORY  1: Negative. Electronically Signed   By: Amie Portland M.D.   On: 07/09/2020 11:57    ROS: 14 pt review of systems  performed and negative (unless mentioned in an HPI)  Objective: BP 108/67   Pulse 70   Temp 98.1 F (36.7 C) (Oral)   Ht 5' 1.5" (1.562 m)   Wt 126 lb (57.2 kg)   SpO2 96%   BMI 23.42 kg/m  Gen: Afebrile. No acute distress. Nontoxic in appearance, well-developed, well-nourished,  pleasant female HENT: AT. [place]. Bilateral TM visualized and normal in appearance, normal external auditory canal. MMM, no oral lesions, adequate dentition. Bilateral nares within normal limits. Throat without erythema, ulcerations or exudates. no Cough on exam, no hoarseness on exam. Eyes:Pupils Equal Round Reactive to light, Extraocular movements intact,  Conjunctiva without redness, discharge or icterus. Neck/lymp/endocrine: Supple,no lymphadenopathy, no thyromegaly CV: RRR no murmur, no edema, +2/4 P posterior tibialis pulses.  Chest: CTAB, no wheeze, rhonchi or crackles. normal Respiratory effort. good Air movement. Abd: Soft. flat. NTND. BS present. no Masses palpated. No hepatosplenomegaly. No rebound tenderness or guarding. Skin: no rashes, purpura or petechiae. Warm and well-perfused. Skin intact. Neuro/Msk: Normal gait. PERLA. EOMi. Alert. Oriented x3.  Cranial nerves II through XII intact. Muscle strength 5/5 upper/lower extremity. DTRs equal bilaterally. Psych: Normal affect, dress and demeanor. Normal speech. Normal thought content and judgment.   No results found.  Assessment/plan: Jasmine Lee is a 65 y.o. female present for CPE/cmc Hypertension: Stable . Continue  losartan-HCTZ Continue baby aspirin - CBC with Differential/Platelet - Comprehensive metabolic panel - Lipid panel - TSH Routine exercise and healthy diet recommended. Follow-up 5.5 months   Osteoarthritis, unspecified osteoarthritis type, unspecified site avoid NSAIDs with her history of GERD and elevated liver enzymes Discussed turmeric supplement Aspercreme and capsaicin for carpometacarpal joints.   Postnasal  drip Stable.  Discontinue Claritin Continue  Zyrtec nightly Continue Atrovent nightly   GERD without esophagitis Protonix 40 mg twice daily-medication managed by GI Dr. Orvan Falconer. Continue  Pepcid 20 mg twice daily  Osteopenia, unspecified location - VITAMIN D 25 Hydroxy (Vit-D Deficiency, Fractures) Diabetes mellitus screening - Hemoglobin A1c Elevated alkaline phosphatase level - VITAMIN D 25 Hydroxy (Vit-D Deficiency, Fractures)  Patient was encouraged to exercise greater than 150 minutes a week. Patient was encouraged to choose a diet filled with fresh fruits and vegetables, and lean meats. AVS provided to patient today for education/recommendation on gender specific health and safety maintenance. Return in  about 6 months (around 01/28/2021) for CMC (30 min) and 69yr1d CPE.  Orders Placed This Encounter  Procedures   CBC with Differential/Platelet   Comprehensive metabolic panel   Hemoglobin A1c   Lipid panel   TSH   VITAMIN D 25 Hydroxy (Vit-D Deficiency, Fractures)     Orders Placed This Encounter  Procedures   CBC with Differential/Platelet   Comprehensive metabolic panel   Hemoglobin A1c   Lipid panel   TSH   VITAMIN D 25 Hydroxy (Vit-D Deficiency, Fractures)    Meds ordered this encounter  Medications   losartan-hydrochlorothiazide (HYZAAR) 50-12.5 MG tablet    Sig: Take 1 tablet by mouth daily.    Dispense:  90 tablet    Refill:  1   famotidine (PEPCID) 20 MG tablet    Sig: Take 1 tablet (20 mg total) by mouth 2 (two) times daily.    Dispense:  90 tablet    Refill:  3   ipratropium (ATROVENT) 0.06 % nasal spray    Sig: Place 2 sprays into both nostrils 4 (four) times daily.    Dispense:  15 mL    Refill:  5    Referral Orders  No referral(s) requested today     Electronically signed by: Felix Pacini, DO [facility] [place]

## 2020-08-11 NOTE — Patient Instructions (Signed)
Health Maintenance, Female Adopting a healthy lifestyle and getting preventive care are important in promoting health and wellness. Ask your health care provider about: The right schedule for you to have regular tests and exams. Things you can do on your own to prevent diseases and keep yourself healthy. What should I know about diet, weight, and exercise? Eat a healthy diet  Eat a diet that includes plenty of vegetables, fruits, low-fat dairy products, and lean protein. Do not eat a lot of foods that are high in solid fats, added sugars, or sodium.  Maintain a healthy weight Body mass index (BMI) is used to identify weight problems. It estimates body fat based on height and weight. Your health care provider can help determineyour BMI and help you achieve or maintain a healthy weight. Get regular exercise Get regular exercise. This is one of the most important things you can do for your health. Most adults should: Exercise for at least 150 minutes each week. The exercise should increase your heart rate and make you sweat (moderate-intensity exercise). Do strengthening exercises at least twice a week. This is in addition to the moderate-intensity exercise. Spend less time sitting. Even light physical activity can be beneficial. Watch cholesterol and blood lipids Have your blood tested for lipids and cholesterol at 64 years of age, then havethis test every 5 years. Have your cholesterol levels checked more often if: Your lipid or cholesterol levels are high. You are older than 64 years of age. You are at high risk for heart disease. What should I know about cancer screening? Depending on your health history and family history, you may need to have cancer screening at various ages. This may include screening for: Breast cancer. Cervical cancer. Colorectal cancer. Skin cancer. Lung cancer. What should I know about heart disease, diabetes, and high blood pressure? Blood pressure and heart  disease High blood pressure causes heart disease and increases the risk of stroke. This is more likely to develop in people who have high blood pressure readings, are of African descent, or are overweight. Have your blood pressure checked: Every 3-5 years if you are 18-39 years of age. Every year if you are 40 years old or older. Diabetes Have regular diabetes screenings. This checks your fasting blood sugar level. Have the screening done: Once every three years after age 40 if you are at a normal weight and have a low risk for diabetes. More often and at a younger age if you are overweight or have a high risk for diabetes. What should I know about preventing infection? Hepatitis B If you have a higher risk for hepatitis B, you should be screened for this virus. Talk with your health care provider to find out if you are at risk forhepatitis B infection. Hepatitis C Testing is recommended for: Everyone born from 1945 through 1965. Anyone with known risk factors for hepatitis C. Sexually transmitted infections (STIs) Get screened for STIs, including gonorrhea and chlamydia, if: You are sexually active and are younger than 64 years of age. You are older than 64 years of age and your health care provider tells you that you are at risk for this type of infection. Your sexual activity has changed since you were last screened, and you are at increased risk for chlamydia or gonorrhea. Ask your health care provider if you are at risk. Ask your health care provider about whether you are at high risk for HIV. Your health care provider may recommend a prescription medicine to help   prevent HIV infection. If you choose to take medicine to prevent HIV, you should first get tested for HIV. You should then be tested every 3 months for as long as you are taking the medicine. Pregnancy If you are about to stop having your period (premenopausal) and you may become pregnant, seek counseling before you get  pregnant. Take 400 to 800 micrograms (mcg) of folic acid every day if you become pregnant. Ask for birth control (contraception) if you want to prevent pregnancy. Osteoporosis and menopause Osteoporosis is a disease in which the bones lose minerals and strength with aging. This can result in bone fractures. If you are 65 years old or older, or if you are at risk for osteoporosis and fractures, ask your health care provider if you should: Be screened for bone loss. Take a calcium or vitamin D supplement to lower your risk of fractures. Be given hormone replacement therapy (HRT) to treat symptoms of menopause. Follow these instructions at home: Lifestyle Do not use any products that contain nicotine or tobacco, such as cigarettes, e-cigarettes, and chewing tobacco. If you need help quitting, ask your health care provider. Do not use street drugs. Do not share needles. Ask your health care provider for help if you need support or information about quitting drugs. Alcohol use Do not drink alcohol if: Your health care provider tells you not to drink. You are pregnant, may be pregnant, or are planning to become pregnant. If you drink alcohol: Limit how much you use to 0-1 drink a day. Limit intake if you are breastfeeding. Be aware of how much alcohol is in your drink. In the [place] one drink equals one 12 oz bottle of beer (355 mL), one 5 oz glass of wine (148 mL), or one 1 oz glass of hard liquor (44 mL). General instructions Schedule regular health, dental, and eye exams. Stay current with your vaccines. Tell your health care provider if: You often feel depressed. You have ever been abused or do not feel safe at home. Summary Adopting a healthy lifestyle and getting preventive care are important in promoting health and wellness. Follow your health care provider's instructions about healthy diet, exercising, and getting tested or screened for diseases. Follow your health care provider's  instructions on monitoring your cholesterol and blood pressure. This information is not intended to replace advice given to you by your health care provider. Make sure you discuss any questions you have with your healthcare provider. Document Revised: 01/04/2018 Document Reviewed: 01/04/2018 Elsevier Patient Education  2022 [employer] Inc.  

## 2020-08-12 ENCOUNTER — Telehealth: Payer: Self-pay | Admitting: Family Medicine

## 2020-08-12 NOTE — Telephone Encounter (Signed)
Noted  

## 2020-08-12 NOTE — Telephone Encounter (Signed)
Patient returned call for lab results. I relayed Dr. Alan Ripper message as follows: "Liver, kidney and thyroid function are normal Blood cell counts and electrolytes are normal Diabetes screening/A1c is normal at 5.8, with a normal fasting glucose. Cholesterol panel looks good and is at goal for her with an LDL of 126. Vitamin D levels look excellent at 66." Patient voiced happy understanding.

## 2020-12-02 ENCOUNTER — Other Ambulatory Visit: Payer: Self-pay | Admitting: Family Medicine

## 2021-02-02 ENCOUNTER — Ambulatory Visit: Payer: 59 | Admitting: Family Medicine

## 2021-02-20 ENCOUNTER — Other Ambulatory Visit: Payer: Self-pay

## 2021-02-20 ENCOUNTER — Ambulatory Visit (INDEPENDENT_AMBULATORY_CARE_PROVIDER_SITE_OTHER): Payer: 59 | Admitting: Family Medicine

## 2021-02-20 ENCOUNTER — Encounter: Payer: Self-pay | Admitting: Family Medicine

## 2021-02-20 VITALS — BP 100/66 | HR 71 | Temp 98.1°F | Wt 119.0 lb

## 2021-02-20 DIAGNOSIS — I10 Essential (primary) hypertension: Secondary | ICD-10-CM

## 2021-02-20 DIAGNOSIS — M8580 Other specified disorders of bone density and structure, unspecified site: Secondary | ICD-10-CM | POA: Diagnosis not present

## 2021-02-20 DIAGNOSIS — K219 Gastro-esophageal reflux disease without esophagitis: Secondary | ICD-10-CM | POA: Diagnosis not present

## 2021-02-20 MED ORDER — FAMOTIDINE 20 MG PO TABS: 20.0000 mg | ORAL_TABLET | Freq: Two times a day (BID) | ORAL | 3 refills | Status: DC

## 2021-02-20 MED ORDER — LOSARTAN POTASSIUM-HCTZ 50-12.5 MG PO TABS: 1.0000 | ORAL_TABLET | Freq: Every day | ORAL | 1 refills | Status: DC

## 2021-02-20 MED ORDER — IPRATROPIUM BROMIDE 0.06 % NA SOLN: 2.0000 | Freq: Four times a day (QID) | NASAL | 5 refills | Status: DC

## 2021-02-20 NOTE — Progress Notes (Signed)
This visit occurred during the SARS-CoV-2 public health emergency.  Safety protocols were in place, including screening questions prior to the visit, additional usage of staff PPE, and extensive cleaning of exam room while observing appropriate contact time as indicated for disinfecting solutions.    Patient ID: Jasmine Lee, female  DOB: January 30, 1955, 66 y.o.   MRN: [id number] Patient Care Team    Relationship Specialty Notifications Start End  Ma Hillock, DO PCP - General Family Medicine  11/17/18   Orville Govern, MD Referring Physician   04/26/19   Margie Ege, DPM Referring Physician Surgery  04/26/19   Shamleffer, Melanie Crazier, MD Consulting Physician Endocrinology  11/16/19     Chief Complaint  Patient presents with   Follow-up    Pt is fasting    Subjective: Jasmine Lee is a 66 y.o.  Female  present for [facility]. All past medical history, surgical history, allergies, family history, immunizations, medications and social history were updated in the electronic medical record today. All recent labs, ED visits and hospitalizations within the last year were reviewed.  Hypertension: Pt reports compliance with losartan-HCTZ 50-12.5 mg.. Blood pressures ranges at home within normal limits. Patient denies chest pain, shortness of breath, dizziness or lower extremity edema.   Pt takes a  daily baby ASA. Pt is not prescribed statin.     Osteoarthritis, unspecified osteoarthritis type, unspecified site Patient feels her arthritis is flaring.  She has a history of GERD and does not tolerate NSAIDs very well.  She reports her left elbow and bilateral carpometacarpal joints are the most painful.  encouraged topical OTC treatments in the past.   GERD without esophagitis Patient is prescribed Protonix 40 mg twice daily by Dr. Dahlia Bailiff.  She reports compliance  with Pepcid 20 mg twice daily.    Depression screen [facility] 2/9 08/11/2020 04/24/2019 11/17/2018  Decreased Interest 0 0 0   Down, Depressed, Hopeless 0 1 0  PHQ - 2 Score 0 1 0   No flowsheet data found.  Immunization History  Administered Date(s) Administered   PFIZER(Purple Top)SARS-COV-2 Vaccination 04/15/2019, 05/08/2019, 12/27/2019   Tdap 01/24/2014   Past Medical History:  Diagnosis Date   Allergy    Arthritis    Chicken pox    Endometriosis    GERD (gastroesophageal reflux disease)    Heartburn    History of fainting spells of unknown cause    History of sun-damaged skin 04/26/2014   Actinic keratosis.  Solar elastosis.  Benign nevus of right posterior neck.   HTN (hypertension)    Muscle weakness 01/2017   Patient reports being hospitalized for bilateral upper extremity weakness with negative stroke work-up.  Negative EEG.  Self resolved   Osteomyelitis ([place]) 12/2016   left ankle/foot. Patient had multiple surgeries and treated with antibiotics.   Seasonal allergies    Toxic metabolic encephalopathy 49/8264   Extensive negative work-up reported by PCP records.  Thought to be secondary to IV antibiotics for osteomyelitis.   Allergies  Allergen Reactions   Oxycodone Other (See Comments)    hallucination   Heparin Other (See Comments)    hallucination   Past Surgical History:  Procedure Laterality Date   BUNIONECTOMY  10/2016   Patient reports multiple postsurgical infections October/November and December 2018.   COLONOSCOPY  03/31/2012   Nonbleeding internal hemorrhoids otherwise normal exam.  Repeat in 10 years.   HERNIA REPAIR  1962   LAPAROSCOPIC ENDOMETRIOSIS FULGURATION  1986   RHINOPLASTY  Completed for deviated septum.   WISDOM TOOTH EXTRACTION     Family History  Problem Relation Age of Onset   Arthritis Mother    Hearing loss Mother    Heart attack Mother    Miscarriages / Stillbirths Mother    Hyperlipidemia Mother    Breast cancer Mother 25   Alcohol abuse Father    Diabetes Father    Hypertension Father    Stroke Father    Diabetes Sister    Early death  Sister    Heart disease Sister    Stroke Sister    Arthritis Maternal Grandmother    Hypertension Maternal Grandmother    Diabetes Maternal Grandfather    Early death Maternal Grandfather    Hyperlipidemia Maternal Grandfather    Hypertension Maternal Grandfather    Heart attack Maternal Grandfather    Heart attack Paternal Grandmother    Early death Paternal Grandfather    Heart attack Paternal Grandfather    Depression Sister    Ovarian cancer Maternal Aunt    Colon cancer Neg Hx    Social History   Social History Narrative   Marital status/children/pets: Married.  No children.   Education/employment: Associates degree, retired Air[employer] pilot.   Safety:      -smoke alarm in the home:Yes     - wears seatbelt: Yes     - Feels safe in their relationships: Yes    Allergies as of 02/20/2021       Reactions   Oxycodone Other (See Comments)   hallucination   Heparin Other (See Comments)   hallucination        Medication List        Accurate as of February 20, 2021 10:48 AM. If you have any questions, ask your nurse or doctor.          aspirin EC 81 MG tablet Take 81 mg by mouth daily.   Calcium 250 MG Caps Take 1 capsule by mouth daily.   cetirizine 10 MG tablet Commonly known as: ZYRTEC Take 1 tablet (10 mg total) by mouth at bedtime.   clobetasol 0.05 % external solution Commonly known as: TEMOVATE Apply topically.   famotidine 20 MG tablet Commonly known as: PEPCID Take 1 tablet (20 mg total) by mouth 2 (two) times daily.   Fish Oil 1000 MG Caps Take 1 capsule by mouth daily at 2 PM.   ipratropium 0.06 % nasal spray Commonly known as: ATROVENT Place 2 sprays into both nostrils 4 (four) times daily.   losartan-hydrochlorothiazide 50-12.5 MG tablet Commonly known as: HYZAAR Take 1 tablet by mouth daily.   MULTIVITAMIN ADULT PO Take 1 tablet by mouth daily.   naproxen 250 MG tablet Commonly known as: NAPROSYN Take by mouth 2 (two) times daily  with a meal.   pantoprazole 40 MG tablet Commonly known as: PROTONIX Take 1 tablet (40 mg total) by mouth 2 (two) times daily.   PROBIOTIC ADVANCED PO Take by mouth daily.   VITAMIN D PO Take 2 each by mouth. gummies        All past medical history, surgical history, allergies, family history, immunizations andmedications were updated in the EMR today and reviewed under the history and medication portions of their EMR.     No results found for this or any previous visit (from the past 2160 hour(s)).  MM 3D SCREEN BREAST BILATERAL Result Date: 07/09/2020 iMPRESSION: No mammographic evidence of malignancy. A result letter of this screening mammogram will be mailed directly  to the patient. RECOMMENDATION: Screening mammogram in one year. (Code:SM-B-01Y) BI-RADS CATEGORY  1: Negative. Electronically Signed   By: Lajean Manes M.D.   On: 07/09/2020 11:57    ROS: 14 pt review of systems performed and negative (unless mentioned in an HPI)  Objective: BP 100/66    Pulse 71    Temp 98.1 F (36.7 C)    Wt 119 lb (54 kg)    SpO2 100%    BMI 22.12 kg/m  Physical Exam Vitals and nursing note reviewed.  Constitutional:      General: She is not in acute distress.    Appearance: Normal appearance. She is not ill-appearing, toxic-appearing or diaphoretic.  HENT:     Head: Normocephalic and atraumatic.     Mouth/Throat:     Mouth: Mucous membranes are moist.  Eyes:     General: No scleral icterus.       Right eye: No discharge.        Left eye: No discharge.     Extraocular Movements: Extraocular movements intact.     Conjunctiva/sclera: Conjunctivae normal.     Pupils: Pupils are equal, round, and reactive to light.  Cardiovascular:     Rate and Rhythm: Normal rate and regular rhythm.  Pulmonary:     Effort: Pulmonary effort is normal. No respiratory distress.     Breath sounds: Normal breath sounds. No wheezing, rhonchi or rales.  Musculoskeletal:     Cervical back: Neck supple.  No tenderness.  Lymphadenopathy:     Cervical: No cervical adenopathy.  Skin:    General: Skin is warm and dry.     Coloration: Skin is not jaundiced or pale.     Findings: No erythema or rash.  Neurological:     Mental Status: She is alert and oriented to person, place, and time. Mental status is at baseline.     Motor: No weakness.     Gait: Gait normal.  Psychiatric:        Mood and Affect: Mood normal.        Behavior: Behavior normal.        Thought Content: Thought content normal.        Judgment: Judgment normal.    No results found.  Assessment/plan: Jasmine Lee is a 66 y.o. female present for CPE/cmc Hypertension: She has lost about 5 lbs.  Continue losartan-HCTZ- since she has lost weight she may find she needs less BP med. Encouraged her to 1/2 dose if dizziness and BP lower than 195 systolic Continue baby aspirin Routine exercise and healthy diet recommended. Follow-up 5.5 months   Osteoarthritis, unspecified osteoarthritis type, unspecified site avoid NSAIDs with her history of GERD and elevated liver enzymes Discussed turmeric supplement Aspercreme and capsaicin for carpometacarpal joints.   GERD without esophagitis Protonix 40 mg twice daily-medication managed by GI Dr. Tarri Glenn. Continue  Pepcid 20 mg twice daily  Osteopenia, unspecified location -continue vit d - elevated alk phos> followed by endo now.   Return in about 24 weeks (around 08/07/2021) for CPE (30 min), CMC (30 min).  No orders of the defined types were placed in this encounter.    No orders of the defined types were placed in this encounter.   Meds ordered this encounter  Medications   ipratropium (ATROVENT) 0.06 % nasal spray    Sig: Place 2 sprays into both nostrils 4 (four) times daily.    Dispense:  15 mL    Refill:  5   losartan-hydrochlorothiazide (HYZAAR)  50-12.5 MG tablet    Sig: Take 1 tablet by mouth daily.    Dispense:  90 tablet    Refill:  1   famotidine  (PEPCID) 20 MG tablet    Sig: Take 1 tablet (20 mg total) by mouth 2 (two) times daily.    Dispense:  90 tablet    Refill:  3    Referral Orders  No referral(s) requested today     Electronically signed by: Howard Pouch, [place]

## 2021-02-20 NOTE — Patient Instructions (Signed)
Great to see you today.  I have refilled the medication(s) we provide.   If labs were collected, we will inform you of lab results once received either by echart message or telephone call.   - echart message- for normal results that have been seen by the patient already.   - telephone call: abnormal results or if patient has not viewed results in their echart.  

## 2021-02-26 ENCOUNTER — Other Ambulatory Visit: Payer: Self-pay | Admitting: Family Medicine

## 2021-02-26 ENCOUNTER — Other Ambulatory Visit: Payer: Self-pay | Admitting: Gastroenterology

## 2021-02-26 DIAGNOSIS — K222 Esophageal obstruction: Secondary | ICD-10-CM

## 2021-02-26 DIAGNOSIS — R1310 Dysphagia, unspecified: Secondary | ICD-10-CM

## 2021-06-07 ENCOUNTER — Other Ambulatory Visit: Payer: Self-pay | Admitting: Family Medicine

## 2021-07-29 DIAGNOSIS — L84 Corns and callosities: Secondary | ICD-10-CM | POA: Diagnosis not present

## 2021-07-29 DIAGNOSIS — M79671 Pain in right foot: Secondary | ICD-10-CM | POA: Diagnosis not present

## 2021-07-29 DIAGNOSIS — M2011 Hallux valgus (acquired), right foot: Secondary | ICD-10-CM | POA: Diagnosis not present

## 2021-07-29 DIAGNOSIS — M2042 Other hammer toe(s) (acquired), left foot: Secondary | ICD-10-CM | POA: Diagnosis not present

## 2021-08-04 DIAGNOSIS — L570 Actinic keratosis: Secondary | ICD-10-CM | POA: Diagnosis not present

## 2021-08-04 DIAGNOSIS — Z872 Personal history of diseases of the skin and subcutaneous tissue: Secondary | ICD-10-CM | POA: Diagnosis not present

## 2021-08-04 DIAGNOSIS — D229 Melanocytic nevi, unspecified: Secondary | ICD-10-CM | POA: Diagnosis not present

## 2021-08-04 DIAGNOSIS — L821 Other seborrheic keratosis: Secondary | ICD-10-CM | POA: Diagnosis not present

## 2021-08-04 DIAGNOSIS — L814 Other melanin hyperpigmentation: Secondary | ICD-10-CM | POA: Diagnosis not present

## 2021-08-10 ENCOUNTER — Ambulatory Visit (INDEPENDENT_AMBULATORY_CARE_PROVIDER_SITE_OTHER): Payer: Medicare Other | Admitting: Family Medicine

## 2021-08-10 ENCOUNTER — Encounter: Payer: Self-pay | Admitting: Family Medicine

## 2021-08-10 VITALS — BP 108/70 | HR 61 | Temp 97.6°F | Ht 61.5 in | Wt 120.0 lb

## 2021-08-10 DIAGNOSIS — M8580 Other specified disorders of bone density and structure, unspecified site: Secondary | ICD-10-CM | POA: Diagnosis not present

## 2021-08-10 DIAGNOSIS — Z0000 Encounter for general adult medical examination without abnormal findings: Secondary | ICD-10-CM | POA: Diagnosis not present

## 2021-08-10 DIAGNOSIS — Z23 Encounter for immunization: Secondary | ICD-10-CM

## 2021-08-10 DIAGNOSIS — R1310 Dysphagia, unspecified: Secondary | ICD-10-CM

## 2021-08-10 DIAGNOSIS — I10 Essential (primary) hypertension: Secondary | ICD-10-CM | POA: Diagnosis not present

## 2021-08-10 DIAGNOSIS — Z79899 Other long term (current) drug therapy: Secondary | ICD-10-CM | POA: Diagnosis not present

## 2021-08-10 DIAGNOSIS — Z1231 Encounter for screening mammogram for malignant neoplasm of breast: Secondary | ICD-10-CM

## 2021-08-10 DIAGNOSIS — R748 Abnormal levels of other serum enzymes: Secondary | ICD-10-CM

## 2021-08-10 DIAGNOSIS — K219 Gastro-esophageal reflux disease without esophagitis: Secondary | ICD-10-CM

## 2021-08-10 DIAGNOSIS — K222 Esophageal obstruction: Secondary | ICD-10-CM

## 2021-08-10 LAB — HEMOGLOBIN A1C: Hgb A1c MFr Bld: 6 % (ref 4.6–6.5)

## 2021-08-10 LAB — COMPREHENSIVE METABOLIC PANEL
ALT: 12 U/L (ref 0–35)
AST: 18 U/L (ref 0–37)
Albumin: 4.4 g/dL (ref 3.5–5.2)
Alkaline Phosphatase: 114 U/L (ref 39–117)
BUN: 12 mg/dL (ref 6–23)
CO2: 30 mEq/L (ref 19–32)
Calcium: 9.9 mg/dL (ref 8.4–10.5)
Chloride: 99 mEq/L (ref 96–112)
Creatinine, Ser: 0.83 mg/dL (ref 0.40–1.20)
GFR: 74.14 mL/min (ref >60.00)
Glucose, Bld: 100 mg/dL — ABNORMAL HIGH (ref 70–99)
Potassium: 5.1 mEq/L (ref 3.5–5.1)
Sodium: 138 mEq/L (ref 135–145)
Total Bilirubin: 0.5 mg/dL (ref 0.2–1.2)
Total Protein: 7.3 g/dL (ref 6.0–8.3)

## 2021-08-10 LAB — TSH: TSH: 0.91 u[IU]/mL (ref 0.35–5.50)

## 2021-08-10 LAB — LIPID PANEL
Cholesterol: 232 mg/dL — ABNORMAL HIGH (ref 0–200)
HDL: 98 mg/dL (ref >39.00)
LDL Cholesterol: 119 mg/dL — ABNORMAL HIGH (ref 0–99)
NonHDL: 133.69
Total CHOL/HDL Ratio: 2
Triglycerides: 73 mg/dL (ref 0.0–149.0)
VLDL: 14.6 mg/dL (ref 0.0–40.0)

## 2021-08-10 LAB — CBC
HCT: 41.1 % (ref 36.0–46.0)
Hemoglobin: 13.5 g/dL (ref 12.0–15.0)
MCHC: 32.8 g/dL (ref 30.0–36.0)
MCV: 94.3 fl (ref 78.0–100.0)
Platelets: 344 10*3/uL (ref 150.0–400.0)
RBC: 4.36 Mil/uL (ref 3.87–5.11)
RDW: 14.2 % (ref 11.5–15.5)
WBC: 5.6 10*3/uL (ref 4.0–10.5)

## 2021-08-10 LAB — VITAMIN D 25 HYDROXY (VIT D DEFICIENCY, FRACTURES): VITD: 47.44 ng/mL (ref 30.00–100.00)

## 2021-08-10 MED ORDER — FAMOTIDINE 20 MG PO TABS: 20.0000 mg | ORAL_TABLET | Freq: Two times a day (BID) | ORAL | 3 refills | Status: DC

## 2021-08-10 MED ORDER — LOSARTAN POTASSIUM-HCTZ 50-12.5 MG PO TABS: 1.0000 | ORAL_TABLET | Freq: Every day | ORAL | 1 refills | Status: DC

## 2021-08-10 MED ORDER — CETIRIZINE HCL 10 MG PO TABS: 10.0000 mg | ORAL_TABLET | Freq: Every day | ORAL | 3 refills | Status: DC

## 2021-08-10 NOTE — Patient Instructions (Signed)
No follow-ups on file.        Great to see you today.  I have refilled the medication(s) we provide.   If labs were collected, we will inform you of lab results once received either by echart message or telephone call.   - echart message- for normal results that have been seen by the patient already.   - telephone call: abnormal results or if patient has not viewed results in their echart.   Health Maintenance After Age 65 After age 65, you are at a higher risk for certain long-term diseases and infections as well as injuries from falls. Falls are a major cause of broken bones and head injuries in people who are older than age 65. Getting regular preventive care can help to keep you healthy and well. Preventive care includes getting regular testing and making lifestyle changes as recommended by your health care provider. Talk with your health care provider about: Which screenings and tests you should have. A screening is a test that checks for a disease when you have no symptoms. A diet and exercise plan that is right for you. What should I know about screenings and tests to prevent falls? Screening and testing are the best ways to find a health problem early. Early diagnosis and treatment give you the best chance of managing medical conditions that are common after age 65. Certain conditions and lifestyle choices may make you more likely to have a fall. Your health care provider may recommend: Regular vision checks. Poor vision and conditions such as cataracts can make you more likely to have a fall. If you wear glasses, make sure to get your prescription updated if your vision changes. Medicine review. Work with your health care provider to regularly review all of the medicines you are taking, including over-the-counter medicines. Ask your health care provider about any side effects that may make you more likely to have a fall. Tell your health care provider if any medicines that you take  make you feel dizzy or sleepy. Strength and balance checks. Your health care provider may recommend certain tests to check your strength and balance while standing, walking, or changing positions. Foot health exam. Foot pain and numbness, as well as not wearing proper footwear, can make you more likely to have a fall. Screenings, including: Osteoporosis screening. Osteoporosis is a condition that causes the bones to get weaker and break more easily. Blood pressure screening. Blood pressure changes and medicines to control blood pressure can make you feel dizzy. Depression screening. You may be more likely to have a fall if you have a fear of falling, feel depressed, or feel unable to do activities that you used to do. Alcohol use screening. Using too much alcohol can affect your balance and may make you more likely to have a fall. Follow these instructions at home: Lifestyle Do not drink alcohol if: Your health care provider tells you not to drink. If you drink alcohol: Limit how much you have to: 0-1 drink a day for women. 0-2 drinks a day for men. Know how much alcohol is in your drink. In the [place] one drink equals one 12 oz bottle of beer (355 mL), one 5 oz glass of wine (148 mL), or one 1 oz glass of hard liquor (44 mL). Do not use any products that contain nicotine or tobacco. These products include cigarettes, chewing tobacco, and vaping devices, such as e-cigarettes. If you need help quitting, ask your health care provider. Activity    Follow a regular exercise program to stay fit. This will help you maintain your balance. Ask your health care provider what types of exercise are appropriate for you. If you need a cane or walker, use it as recommended by your health care provider. Wear supportive shoes that have nonskid soles. Safety  Remove any tripping hazards, such as rugs, cords, and clutter. Install safety equipment such as grab bars in bathrooms and safety rails on stairs. Keep  rooms and walkways well-lit. General instructions Talk with your health care provider about your risks for falling. Tell your health care provider if: You fall. Be sure to tell your health care provider about all falls, even ones that seem minor. You feel dizzy, tiredness (fatigue), or off-balance. Take over-the-counter and prescription medicines only as told by your health care provider. These include supplements. Eat a healthy diet and maintain a healthy weight. A healthy diet includes low-fat dairy products, low-fat (lean) meats, and fiber from whole grains, beans, and lots of fruits and vegetables. Stay current with your vaccines. Schedule regular health, dental, and eye exams. Summary Having a healthy lifestyle and getting preventive care can help to protect your health and wellness after age 65. Screening and testing are the best way to find a health problem early and help you avoid having a fall. Early diagnosis and treatment give you the best chance for managing medical conditions that are more common for people who are older than age 65. Falls are a major cause of broken bones and head injuries in people who are older than age 65. Take precautions to prevent a fall at home. Work with your health care provider to learn what changes you can make to improve your health and wellness and to prevent falls. This information is not intended to replace advice given to you by your health care provider. Make sure you discuss any questions you have with your health care provider. Document Revised: 06/02/2020 Document Reviewed: 06/02/2020 Elsevier Patient Education  2023 Else[employer] Inc.  

## 2021-08-10 NOTE — Addendum Note (Signed)
Addended by: Maxie Barb on: 08/10/2021 02:21 PM   Modules accepted: Orders

## 2021-08-10 NOTE — Progress Notes (Signed)
Patient ID: Jasmine Lee, female  DOB: [birth year], 66 y.o.   MRN: [id number] Patient Care Team    Relationship Specialty Notifications Start End  Ma Hillock, DO PCP - General Family Medicine  11/17/18   Orville Govern, MD Referring Physician   04/26/19   Margie Ege, DPM Referring Physician Surgery  04/26/19   Shamleffer, Melanie Crazier, MD Consulting Physician Endocrinology  11/16/19     Chief Complaint  Patient presents with   Annual Exam    Pt is fasting    Subjective:  Jasmine Lee is a 66 y.o.  Female  present for [place] All past medical history, surgical history, allergies, family history, immunizations, medications and social history were updated in the electronic medical record today. All recent labs, ED visits and hospitalizations within the last year were reviewed.  Health maintenance:  Colonoscopy: Colonoscopy completed in 2014.  10-year follow-up per patient. Mammogram: completed: 07/07/2020> ordered [facility] Cervical cancer screening:04/2019- has gyn Immunizations: tdap UTD 2015, Influenza declined (encouraged yearly), COVID completed, Shingrix declined. PNA20 completed today  infectious disease screening: HIV and Hep C doing patient agreeable to testing today. DEXA: Estrogen deficient.  osteopenia 06/2019.> due next yr Assistive device: none Oxygen ZWC:HENI Patient has a Dental home. Hospitalizations/ED visits: reviewed  Hypertension: Pt reports compliance with losartan-HCTZ 50-12.5 mg.Patient denies chest pain, shortness of breath, dizziness or lower extremity edema.   Pt takes a  daily baby ASA. Pt is not prescribed statin.     Osteoarthritis, unspecified osteoarthritis type, unspecified site Patient feels her arthritis is flaring.  She has a history of GERD and does not tolerate NSAIDs very well.  She reports her left elbow and bilateral carpometacarpal joints are the most painful.  encouraged topical OTC treatments in the past.    GERD without  esophagitis Patient is prescribed Protonix 40 mg twice daily by Dr. Dahlia Bailiff.  She reports compliance with Pepcid 20 mg twice daily.       08/10/2021    9:31 AM 08/11/2020    9:10 AM 04/24/2019    8:07 AM 11/17/2018   10:34 AM  Depression screen PHQ 2/9  Decreased Interest 0 0 0 0  Down, Depressed, Hopeless 0 0 1 0  PHQ - 2 Score 0 0 1 0       No data to display           Immunization History  Administered Date(s) Administered   PFIZER(Purple Top)SARS-COV-2 Vaccination 04/15/2019, 05/08/2019, 12/27/2019   Tdap 01/24/2014    Past Medical History:  Diagnosis Date   Allergy    Arthritis    Chicken pox    Endometriosis    GERD (gastroesophageal reflux disease)    Heartburn    History of fainting spells of unknown cause    History of sun-damaged skin 04/26/2014   Actinic keratosis.  Solar elastosis.  Benign nevus of right posterior neck.   HTN (hypertension)    Muscle weakness 01/2017   Patient reports being hospitalized for bilateral upper extremity weakness with negative stroke work-up.  Negative EEG.  Self resolved   Osteomyelitis ([place]) 12/2016   left ankle/foot. Patient had multiple surgeries and treated with antibiotics.   Seasonal allergies    Toxic metabolic encephalopathy [id number]   Extensive negative work-up reported by PCP records.  Thought to be secondary to IV antibiotics for osteomyelitis.   Allergies  Allergen Reactions   Oxycodone Other (See Comments)    hallucination   Heparin Other (See Comments)  hallucination   Past Surgical History:  Procedure Laterality Date   BUNIONECTOMY  10/2016   Patient reports multiple postsurgical infections October/November and December 2018.   COLONOSCOPY  03/31/2012   Nonbleeding internal hemorrhoids otherwise normal exam.  Repeat in 10 years.   HERNIA REPAIR  1962   LAPAROSCOPIC ENDOMETRIOSIS FULGURATION  1986   RHINOPLASTY     Completed for deviated septum.   WISDOM TOOTH EXTRACTION     Family History   Problem Relation Age of Onset   Arthritis Mother    Hearing loss Mother    Heart attack Mother    Miscarriages / Stillbirths Mother    Hyperlipidemia Mother    Breast cancer Mother 9   Alcohol abuse Father    Diabetes Father    Hypertension Father    Stroke Father    Diabetes Sister    Early death Sister    Heart disease Sister    Stroke Sister    Arthritis Maternal Grandmother    Hypertension Maternal Grandmother    Diabetes Maternal Grandfather    Early death Maternal Grandfather    Hyperlipidemia Maternal Grandfather    Hypertension Maternal Grandfather    Heart attack Maternal Grandfather    Heart attack Paternal Grandmother    Early death Paternal Grandfather    Heart attack Paternal Grandfather    Depression Sister    Ovarian cancer Maternal Aunt    Colon cancer Neg Hx    Social History   Social History Narrative   Marital status/children/pets: Married.  No children.   Education/employment: Associates degree, retired Airline pilot.   Safety:      -smoke alarm in the home:Yes     - wears seatbelt: Yes     - Feels safe in their relationships: Yes    Allergies as of 08/10/2021       Reactions   Oxycodone Other (See Comments)   hallucination   Heparin Other (See Comments)   hallucination        Medication List        Accurate as of August 10, 2021  9:52 AM. If you have any questions, ask your nurse or doctor.          STOP taking these medications    naproxen 250 MG tablet Commonly known as: NAPROSYN Stopped by: Howard Pouch, DO       TAKE these medications    aspirin EC 81 MG tablet Take 81 mg by mouth daily.   Calcium 250 MG Caps Take 1 capsule by mouth daily.   cetirizine 10 MG tablet Commonly known as: ZYRTEC Take 1 tablet (10 mg total) by mouth at bedtime.   clobetasol 0.05 % external solution Commonly known as: TEMOVATE Apply topically.   famotidine 20 MG tablet Commonly known as: PEPCID Take 1 tablet (20 mg total) by  mouth 2 (two) times daily.   Fish Oil 1000 MG Caps Take 1 capsule by mouth daily at 2 PM.   ipratropium 0.06 % nasal spray Commonly known as: ATROVENT Place 2 sprays into both nostrils 4 (four) times daily.   losartan-hydrochlorothiazide 50-12.5 MG tablet Commonly known as: HYZAAR Take 1 tablet by mouth daily.   MULTIVITAMIN ADULT PO Take 1 tablet by mouth daily.   pantoprazole 40 MG tablet Commonly known as: PROTONIX Take 1 tablet by mouth twice daily   PROBIOTIC ADVANCED PO Take by mouth daily.   Quercetin 500 MG Caps Take by mouth.   VITAMIN D PO Take 2 each by  mouth. gummies        All past medical history, surgical history, allergies, family history, immunizations andmedications were updated in the EMR today and reviewed under the history and medication portions of their EMR.     No results found for this or any previous visit (from the past 2160 hour(s)).  MM 3D SCREEN BREAST BILATERAL Result Date: 07/09/2020 RECOMMENDATION: Screening mammogram in one year. (Code:SM-B-01Y) BI-RADS CATEGORY  1: Negative. Electronically Signed   By: Lajean Manes M.D.   On: 07/09/2020 11:57    ROS 14 pt review of systems performed and negative (unless mentioned in an HPI)  Objective: BP 108/70   Pulse 61   Temp 97.6 F (36.4 C) (Oral)   Ht 5' 1.5" (1.562 m)   Wt 120 lb (54.4 kg)   SpO2 99%   BMI 22.31 kg/m  Physical Exam Vitals and nursing note reviewed.  Constitutional:      General: She is not in acute distress.    Appearance: Normal appearance. She is not ill-appearing or toxic-appearing.  HENT:     Head: Normocephalic and atraumatic.     Right Ear: Tympanic membrane, ear canal and external ear normal. There is no impacted cerumen.     Left Ear: Tympanic membrane, ear canal and external ear normal. There is no impacted cerumen.     Nose: No congestion or rhinorrhea.     Mouth/Throat:     Mouth: Mucous membranes are moist.     Pharynx: Oropharynx is clear. No  oropharyngeal exudate or posterior oropharyngeal erythema.  Eyes:     General: No scleral icterus.       Right eye: No discharge.        Left eye: No discharge.     Extraocular Movements: Extraocular movements intact.     Conjunctiva/sclera: Conjunctivae normal.     Pupils: Pupils are equal, round, and reactive to light.  Cardiovascular:     Rate and Rhythm: Normal rate and regular rhythm.     Pulses: Normal pulses.     Heart sounds: Normal heart sounds. No murmur heard.    No friction rub. No gallop.  Pulmonary:     Effort: Pulmonary effort is normal. No respiratory distress.     Breath sounds: Normal breath sounds. No stridor. No wheezing, rhonchi or rales.  Chest:     Chest wall: No tenderness.  Abdominal:     General: Abdomen is flat. Bowel sounds are normal. There is no distension.     Palpations: Abdomen is soft. There is no mass.     Tenderness: There is no abdominal tenderness. There is no right CVA tenderness, left CVA tenderness, guarding or rebound.     Hernia: No hernia is present.  Musculoskeletal:        General: No swelling, tenderness or deformity. Normal range of motion.     Cervical back: Normal range of motion and neck supple. No rigidity or tenderness.     Right lower leg: No edema.     Left lower leg: No edema.  Lymphadenopathy:     Cervical: No cervical adenopathy.  Skin:    General: Skin is warm and dry.     Coloration: Skin is not jaundiced or pale.     Findings: No bruising, erythema, lesion or rash.  Neurological:     General: No focal deficit present.     Mental Status: She is alert and oriented to person, place, and time. Mental status is at baseline.  Cranial Nerves: No cranial nerve deficit.     Sensory: No sensory deficit.     Motor: No weakness.     Coordination: Coordination normal.     Gait: Gait normal.     Deep Tendon Reflexes: Reflexes normal.  Psychiatric:        Mood and Affect: Mood normal.        Behavior: Behavior normal.         Thought Content: Thought content normal.        Judgment: Judgment normal.        No results found.  Assessment/plan: Jasmine Lee is a 66 y.o. female present for CPE/CMC Hypertension: stable Continue  losartan-HCTZ- since she has lost weight she may find she needs less BP med. Encouraged her to 1/2 dose if dizziness and BP lower than 884 systolic Continue baby aspirin Routine exercise and healthy diet recommended. - CBC - Comprehensive metabolic panel - Lipid panel - TSH Follow-up 5.5 months   Osteoarthritis, unspecified osteoarthritis type, unspecified site avoid NSAIDs with her history of GERD and elevated liver enzymes Discussed turmeric supplement Aspercreme and capsaicin for carpometacarpal joints.   GERD without esophagitis Protonix 40 mg twice daily-medication managed by GI Dr. Tarri Glenn. Continue Pepcid 20 mg twice daily  Osteopenia, unspecified location -continue vit d - elevated alk phos> followed by endo now.   Osteopenia, unspecified location - Vitamin D (25 hydroxy) Elevated alkaline phosphatase level - Vitamin D (25 hydroxy) Encounter for long-term current use of medication - Hemoglobin A1c Breast cancer screening by mammogram - MM 3D SCREEN BREAST BILATERAL; Future Need for prophylactic vaccination against Streptococcus pneumoniae (pneumococcus) PNA20 completed today Routine general medical examination at a health care facility Colonoscopy: Colonoscopy completed in 2014.  10-year follow-up per patient. Mammogram: completed: 07/07/2020> ordered [facility] Cervical cancer screening:04/2019- has gyn Immunizations: tdap UTD 2015, Influenza declined (encouraged yearly), COVID completed, Shingrix declined. PNA20 completed today  infectious disease screening: HIV and Hep C doing patient agreeable to testing today. DEXA: Estrogen deficient.  osteopenia 06/2019.> due next yr Patient was encouraged to exercise greater than 150 minutes a week. Patient was  encouraged to choose a diet filled with fresh fruits and vegetables, and lean meats. AVS provided to patient today for education/recommendation on gender specific health and safety maintenance.  Return in about 24 weeks (around 01/25/2022) for Routine chronic condition follow-up.  Orders Placed This Encounter  Procedures   MM 3D SCREEN BREAST BILATERAL   CBC   Comprehensive metabolic panel   Hemoglobin A1c   Lipid panel   TSH   Vitamin D (25 hydroxy)    Orders Placed This Encounter  Procedures   MM 3D SCREEN BREAST BILATERAL   CBC   Comprehensive metabolic panel   Hemoglobin A1c   Lipid panel   TSH   Vitamin D (25 hydroxy)   Meds ordered this encounter  Medications   famotidine (PEPCID) 20 MG tablet    Sig: Take 1 tablet (20 mg total) by mouth 2 (two) times daily.    Dispense:  90 tablet    Refill:  3   losartan-hydrochlorothiazide (HYZAAR) 50-12.5 MG tablet    Sig: Take 1 tablet by mouth daily.    Dispense:  90 tablet    Refill:  1   cetirizine (ZYRTEC) 10 MG tablet    Sig: Take 1 tablet (10 mg total) by mouth at bedtime.    Dispense:  90 tablet    Refill:  3   Referral Orders  No referral(s)  requested today     Electronically signed by: Howard Pouch, DO [place]

## 2021-08-11 ENCOUNTER — Telehealth: Payer: Self-pay

## 2021-08-11 NOTE — Telephone Encounter (Signed)
Spoke with pt regarding labs and instructions.   

## 2021-08-11 NOTE — Telephone Encounter (Signed)
Patient calling in regards to lab results.  Please call [phone]

## 2021-09-08 ENCOUNTER — Ambulatory Visit: Payer: Medicare Other

## 2021-09-16 ENCOUNTER — Ambulatory Visit
Admission: RE | Admit: 2021-09-16 | Discharge: 2021-09-16 | Disposition: A | Discharge status: Home / Self Care | Payer: Medicare Other | Source: Ambulatory Visit | Attending: Family Medicine | Admitting: Family Medicine

## 2021-09-16 DIAGNOSIS — Z1231 Encounter for screening mammogram for malignant neoplasm of breast: Secondary | ICD-10-CM | POA: Diagnosis not present

## 2021-10-26 ENCOUNTER — Encounter: Payer: Self-pay | Admitting: Family Medicine

## 2021-10-26 ENCOUNTER — Ambulatory Visit (INDEPENDENT_AMBULATORY_CARE_PROVIDER_SITE_OTHER): Payer: Medicare Other | Admitting: Family Medicine

## 2021-10-26 VITALS — BP 103/70 | HR 70 | Temp 98.2°F | Ht 62.0 in | Wt 121.6 lb

## 2021-10-26 DIAGNOSIS — M8580 Other specified disorders of bone density and structure, unspecified site: Secondary | ICD-10-CM | POA: Diagnosis not present

## 2021-10-26 DIAGNOSIS — Z1231 Encounter for screening mammogram for malignant neoplasm of breast: Secondary | ICD-10-CM

## 2021-10-26 DIAGNOSIS — Z0000 Encounter for general adult medical examination without abnormal findings: Secondary | ICD-10-CM | POA: Diagnosis not present

## 2021-10-26 DIAGNOSIS — Z1211 Encounter for screening for malignant neoplasm of colon: Secondary | ICD-10-CM | POA: Diagnosis not present

## 2021-10-26 NOTE — Patient Instructions (Addendum)
  Ms. Bolls , Thank you for taking time to come for your Medicare Wellness Visit. I appreciate your ongoing commitment to your health goals. Please review the following plan we discussed and let me know if I can assist you in the future.   These are the goals we discussed:  Goals      Pt just wants to lose a few lbs        This is a list of the screening recommended for you and due dates:  Health Maintenance  Topic Date Due   Zoster (Shingles) Vaccine (1 of 2) 11/10/2021*   Flu Shot  04/25/2022*   Colon Cancer Screening  04/01/2022   Pap Smear  05/17/2022   DEXA scan (bone density measurement)  06/26/2022   Mammogram  09/17/2023   Tetanus Vaccine  01/25/2024   Pneumonia Vaccine  Completed   Hepatitis C Screening: USPSTF Recommendation to screen - Ages 18-79 yo.  Completed   HIV Screening  Completed   HPV Vaccine  Aged Out   COVID-19 Vaccine  Discontinued  *Topic was postponed. The date shown is not the original due date.

## 2021-10-26 NOTE — Progress Notes (Signed)
Welcome to [facility] preventive exam  Patient Care Team    Relationship Specialty Notifications Start End  Natalia Leatherwood, DO PCP - General Family Medicine  11/17/18   Vernia Buff, MD Referring Physician   04/26/19   Yehuda Mao, DPM Referring Physician Surgery  04/26/19   Shamleffer, Konrad Dolores, MD Consulting Physician Endocrinology  11/16/19     Chief Complaint  Patient presents with   Medicare Wellness   WELCOME TO    History of Present Ilness: Jasmine Lee, 66 y.o. , female presents today for welcome to Medicare wellness visit.   Health maintenance:  Colonoscopy: Colonoscopy completed in 2014.  10-year follow-up per patient. Referral back to Dr. Orvan Falconer (did egd) Mammogram: completed: 09/13/2021 ordered [facility] Cervical cancer screening:04/2019- has gyn Immunizations: tdap UTD 2015, Influenza declined(encouraged yearly), COVID completed, Shingrix declined. PNA20 completed   infectious disease screening: HIV and Hep C completed DEXA: Estrogen deficient.  osteopenia 06/2019.> due next yr Glaucoma screen: Requested records ([facility])  Past medical, surgical, family and social histories reviewed (including experiences with illnesses, hospital stays, operations, injuries, and treatments):  Past Medical History:  Diagnosis Date   Allergy    Arthritis    Chicken pox    Endometriosis    GERD (gastroesophageal reflux disease)    Heartburn    History of fainting spells of unknown cause    History of sun-damaged skin 04/26/2014   Actinic keratosis.  Solar elastosis.  Benign nevus of right posterior neck.   HTN (hypertension)    Muscle weakness 01/2017   Patient reports being hospitalized for bilateral upper extremity weakness with negative stroke work-up.  Negative EEG.  Self resolved   Osteomyelitis (HCC) 12/2016   left ankle/foot. Patient had multiple surgeries and treated with antibiotics.   Seasonal allergies    Toxic metabolic encephalopathy  01/2017   Extensive negative work-up reported by PCP records.  Thought to be secondary to IV antibiotics for osteomyelitis.   All allergies reviewed Allergies  Allergen Reactions   Oxycodone Other (See Comments)    hallucination   Heparin Other (See Comments)    hallucination   Past Surgical History:  Procedure Laterality Date   BUNIONECTOMY  10/2016   Patient reports multiple postsurgical infections October/November and December 2018.   COLONOSCOPY  03/31/2012   Nonbleeding internal hemorrhoids otherwise normal exam.  Repeat in 10 years.   HERNIA REPAIR  1962   LAPAROSCOPIC ENDOMETRIOSIS FULGURATION  1986   RHINOPLASTY     Completed for deviated septum.   WISDOM TOOTH EXTRACTION     Family History  Problem Relation Age of Onset   Arthritis Mother    Hearing loss Mother    Heart attack Mother    Miscarriages / Stillbirths Mother    Hyperlipidemia Mother    Breast cancer Mother 43   Alcohol abuse Father    Diabetes Father    Hypertension Father    Stroke Father    Diabetes Sister    Early death Sister    Heart disease Sister    Stroke Sister    Arthritis Maternal Grandmother    Hypertension Maternal Grandmother    Diabetes Maternal Grandfather    Early death Maternal Grandfather    Hyperlipidemia Maternal Grandfather    Hypertension Maternal Grandfather    Heart attack Maternal Grandfather    Heart attack Paternal Grandmother    Early death Paternal Grandfather    Heart attack Paternal Grandfather    Depression Sister  Ovarian cancer Maternal Aunt    Colon cancer Neg Hx    Social History   Social History Narrative   Marital status/children/pets: Married.  No children.   Education/employment: Associates degree, retired Counsellor.   Safety:      -smoke alarm in the home:Yes     - wears seatbelt: Yes     - Feels safe in their relationships: Yes    All medications verified Allergies as of 10/26/2021       Reactions   Oxycodone Other (See Comments)    hallucination   Heparin Other (See Comments)   hallucination        Medication List        Accurate as of October 26, 2021  2:25 PM. If you have any questions, ask your nurse or doctor.          aspirin EC 81 MG tablet Take 81 mg by mouth daily.   Calcium 250 MG Caps Take 1 capsule by mouth daily.   cetirizine 10 MG tablet Commonly known as: ZYRTEC Take 1 tablet (10 mg total) by mouth at bedtime.   clobetasol 0.05 % external solution Commonly known as: TEMOVATE Apply topically.   famotidine 20 MG tablet Commonly known as: PEPCID Take 1 tablet (20 mg total) by mouth 2 (two) times daily.   Fish Oil 1000 MG Caps Take 1 capsule by mouth daily at 2 PM.   ipratropium 0.06 % nasal spray Commonly known as: ATROVENT Place 2 sprays into both nostrils 4 (four) times daily.   losartan-hydrochlorothiazide 50-12.5 MG tablet Commonly known as: HYZAAR Take 1 tablet by mouth daily.   MULTIVITAMIN ADULT PO Take 1 tablet by mouth daily.   pantoprazole 40 MG tablet Commonly known as: PROTONIX Take 1 tablet by mouth twice daily   PROBIOTIC ADVANCED PO Take by mouth daily.   Quercetin 500 MG Caps Take by mouth.   VITAMIN D PO Take 2 each by mouth. gummies        Depression Screen    10/26/2021    1:47 PM 08/10/2021    9:31 AM 08/11/2020    9:10 AM 04/24/2019    8:07 AM 11/17/2018   10:34 AM  Depression screen PHQ 2/9  Decreased Interest 0 0 0 0 0  Down, Depressed, Hopeless 0 0 0 1 0  PHQ - 2 Score 0 0 0 1 0       No data to display          Immunizations: Immunization History  Administered Date(s) Administered   PFIZER(Purple Top)SARS-COV-2 Vaccination 04/15/2019, 05/08/2019, 12/27/2019   PNEUMOCOCCAL CONJUGATE-20 08/10/2021   Tdap 01/24/2014    Cognitive Function        10/26/2021    1:51 PM  6CIT Screen  What Year? 0 points  What month? 0 points  What time? 0 points  Count back from 20 0 points  Months in reverse 0 points  Repeat  phrase 0 points  Total Score 0 points    Exercise: Current Exercise Habits: Home exercise routine, Type of exercise: walking, Time (Minutes): 60, Frequency (Times/Week): 7, Weekly Exercise (Minutes/Week): 420, Intensity: Mild    Diet: Regular  Functional Status Survey: Get up and go test: steady and less than 20 seconds.  Is the patient deaf or have difficulty hearing?: No Does the patient have difficulty seeing, even when wearing glasses/contacts?: No Does the patient have difficulty concentrating, remembering, or making decisions?: No Does the patient have difficulty walking or climbing stairs?:  No Does the patient have difficulty dressing or bathing?: No Does the patient have difficulty doing errands alone such as visiting a doctor's office or shopping?: No Current Exercise Habits: Home exercise routine, Type of exercise: walking, Time (Minutes): 60, Frequency (Times/Week): 7, Weekly Exercise (Minutes/Week): 420, Intensity: Mild      10/26/2021    1:49 PM 08/10/2021    9:31 AM  Fall Risk   Falls in the past year? 0 0  Number falls in past yr: 0 0  Injury with Fall? 0 0  Risk for fall due to : No Fall Risks No Fall Risks  Follow up Falls evaluation completed Falls evaluation completed   Physical Activity: Sufficiently Active (10/26/2021)   Exercise Vital Sign    Days of Exercise per Week: 7 days    Minutes of Exercise per Session: 60 min    Social Connections: Moderately Isolated (10/26/2021)   Social Connection and Isolation Panel [NHANES]    Frequency of Communication with Friends and Family: More than three times a week    Frequency of Social Gatherings with Friends and Family: Three times a week    Attends Religious Services: Never    Active Member of Clubs or Organizations: No    Attends BankerClub or Organization Meetings: Never    Marital Status: Married   International aid/development workerocial Determinants of Health with Concerns   Social Connections: Moderately Isolated (10/26/2021)   Social  Connection and Isolation Panel [NHANES]    Frequency of Communication with Friends and Family: More than three times a week    Frequency of Social Gatherings with Friends and Family: Three times a week    Attends Religious Services: Never    Active Member of Clubs or Organizations: No    Attends BankerClub or Organization Meetings: Never    Marital Status: Married   Tobacco Use: Low Risk  (10/26/2021)   Patient History    Smoking Tobacco Use: Never    Smokeless Tobacco Use: Never    Passive Exposure: Not on file   @alcohol @ Flowsheet Row [facility]  AUDIT-C Score 4       Advanced Directives: has NO advanced directive - not interested in additional information  Review of Systems  All other systems reviewed and are negative.   Objective  BP 103/70   Pulse 70   Temp 98.2 F (36.8 C)   Ht 5\' 2"  (1.575 m)   Wt 121 lb 9.6 oz (55.2 kg)   SpO2 97%   BMI 22.24 kg/m  Physical Exam Vitals and nursing note reviewed.  Constitutional:      General: She is not in acute distress.    Appearance: Normal appearance. She is not ill-appearing or toxic-appearing.  HENT:     Head: Normocephalic and atraumatic.     Right Ear: Tympanic membrane, ear canal and external ear normal. There is no impacted cerumen.     Left Ear: Tympanic membrane, ear canal and external ear normal. There is no impacted cerumen.     Nose: No congestion or rhinorrhea.     Mouth/Throat:     Mouth: Mucous membranes are moist.     Pharynx: Oropharynx is clear. No oropharyngeal exudate or posterior oropharyngeal erythema.  Eyes:     General: No scleral icterus.       Right eye: No discharge.        Left eye: No discharge.     Extraocular Movements: Extraocular movements intact.  Conjunctiva/sclera: Conjunctivae normal.     Pupils: Pupils are equal, round, and reactive to light.  Cardiovascular:     Rate and Rhythm: Normal rate and regular rhythm.     Pulses:  Normal pulses.     Heart sounds: Normal heart sounds. No murmur heard.    No friction rub. No gallop.  Pulmonary:     Effort: Pulmonary effort is normal. No respiratory distress.     Breath sounds: Normal breath sounds. No stridor. No wheezing, rhonchi or rales.  Chest:     Chest wall: No tenderness.  Abdominal:     General: Abdomen is flat. Bowel sounds are normal. There is no distension.     Palpations: Abdomen is soft. There is no mass.     Tenderness: There is no abdominal tenderness. There is no right CVA tenderness, left CVA tenderness, guarding or rebound.     Hernia: No hernia is present.  Musculoskeletal:        General: No swelling, tenderness or deformity. Normal range of motion.     Cervical back: Normal range of motion and neck supple. No rigidity or tenderness.     Right lower leg: No edema.     Left lower leg: No edema.  Lymphadenopathy:     Cervical: No cervical adenopathy.  Skin:    General: Skin is warm and dry.     Coloration: Skin is not jaundiced or pale.     Findings: No bruising, erythema, lesion or rash.  Neurological:     General: No focal deficit present.     Mental Status: She is alert and oriented to person, place, and time. Mental status is at baseline.     Cranial Nerves: No cranial nerve deficit.     Sensory: No sensory deficit.     Motor: No weakness.     Coordination: Coordination normal.     Gait: Gait normal.     Deep Tendon Reflexes: Reflexes normal.  Psychiatric:        Mood and Affect: Mood normal.        Behavior: Behavior normal.        Thought Content: Thought content normal.        Judgment: Judgment normal.     Hearing Screening   500Hz  1000Hz  2000Hz  4000Hz   Right ear 40 30 30 50  Left ear 25 25 15  40   Vision Screening   Right eye Left eye Both eyes  Without correction 20/30 20/30 20/30   With correction       Assessment and Plan: Jasmine Lee is a 66 y.o. patient present today for their WELCOME to Medicare Wellness Exam   There are no diagnoses linked to this encounter. AAA screen: not indicated EKG screen: not indicated Alcohol abuse screening: completed if indicated/not indicated STIs screening: not indicated Tobacco Use: Low Risk  (10/26/2021)   Patient History    Smoking Tobacco Use: Never    Smokeless Tobacco Use: Never    Passive Exposure: Not on file   The patient is not currently a tobacco user. Counseling given: Not Answered    The patient is asked to make an attempt to improve diet and exercise patterns to aid in medical management of chronic problems and improve overall health.    Goals      Pt just wants to lose a few lbs        Goals Addressed             This Visit's Progress  Pt just wants to lose a few lbs            I have personally reviewed and noted the following in the patient's chart:   Medical and social history Use of alcohol, tobacco or illicit drugs  Current medications and supplements Functional ability and status Nutritional status Physical activity Advanced directives List of other physicians Hospitalizations, surgeries, and ER visits in previous 12 months Vitals Screenings to include cognitive, depression, and falls Referrals and appointments  In addition, I have reviewed and discussed with patient certain preventive protocols, quality metrics, and best practice recommendations. A written personalized care plan for preventive services as well as general preventive health recommendations were provided to patient.     Electronically Signed by: Howard Pouch, DO [facility]

## 2022-01-28 ENCOUNTER — Encounter: Payer: Self-pay | Admitting: Family Medicine

## 2022-01-28 ENCOUNTER — Ambulatory Visit (INDEPENDENT_AMBULATORY_CARE_PROVIDER_SITE_OTHER): Payer: Medicare HMO | Admitting: Family Medicine

## 2022-01-28 VITALS — BP 127/84 | HR 67 | Temp 98.2°F | Wt 125.0 lb

## 2022-01-28 DIAGNOSIS — K219 Gastro-esophageal reflux disease without esophagitis: Secondary | ICD-10-CM

## 2022-01-28 DIAGNOSIS — I10 Essential (primary) hypertension: Secondary | ICD-10-CM

## 2022-01-28 MED ORDER — LOSARTAN POTASSIUM-HCTZ 50-12.5 MG PO TABS
1.0000 | ORAL_TABLET | Freq: Every day | ORAL | 1 refills | Status: DC
Start: 2022-01-28 — End: 2022-08-31

## 2022-01-28 MED ORDER — IPRATROPIUM BROMIDE 0.06 % NA SOLN
2.0000 | Freq: Four times a day (QID) | NASAL | 5 refills | Status: DC
Start: 2022-01-28 — End: 2022-08-31

## 2022-01-28 NOTE — Patient Instructions (Signed)
No follow-ups on file.        Great to see you today.  I have refilled the medication(s) we provide.   If labs were collected, we will inform you of lab results once received either by echart message or telephone call.   - echart message- for normal results that have been seen by the patient already.   - telephone call: abnormal results or if patient has not viewed results in their echart.  

## 2022-01-28 NOTE — Progress Notes (Signed)
Patient ID: Jasmine Lee, female  DOB: 11-Feb-1955, 67 y.o.   MRN: [id number] Patient Care Team    Relationship Specialty Notifications Start End  Ma Hillock, DO PCP - General Family Medicine  11/17/18   Orville Govern, MD Referring Physician   04/26/19   Powers, Belia Heman, DPM Referring Physician Surgery  04/26/19   Shamleffer, Melanie Crazier, MD Consulting Physician Endocrinology  11/16/19   Merlyn Albert, DPM Referring Physician Podiatry  01/28/22   Benedetto Coons, [place]  Dermatology  01/28/22     Chief Complaint  Patient presents with   Hypertension    Subjective:  Loreda Silverio is a 67 y.o.  Female  present for routine chronic condition management All past medical history, surgical history, allergies, family history, immunizations, medications and social history were updated in the electronic medical record today. All recent labs, ED visits and hospitalizations within the last year were reviewed.  Hypertension: Pt reports compliance with losartan-HCTZ 50-12.5 mg. Patient denies chest pain, shortness of breath, dizziness or lower extremity edema.  Pt takes a  daily baby ASA. Pt is not prescribed statin.   Osteoarthritis, unspecified osteoarthritis type, unspecified site Patient feels her arthritis is flaring.  She has a history of GERD and does not tolerate NSAIDs very well.  She reports her left elbow and bilateral carpometacarpal joints are the most painful.  encouraged topical OTC treatments in the past.    GERD without esophagitis Patient is prescribed Protonix 40 mg twice daily by Dr. Dahlia Bailiff.  She reports compliance with Pepcid 20 mg twice daily.       10/26/2021    1:47 PM 08/10/2021    9:31 AM 08/11/2020    9:10 AM 04/24/2019    8:07 AM 11/17/2018   10:34 AM  Depression screen PHQ 2/9  Decreased Interest 0 0 0 0 0  Down, Depressed, Hopeless 0 0 0 1 0  PHQ - 2 Score 0 0 0 1 0       No data to display           Immunization History   Administered Date(s) Administered   PFIZER(Purple Top)SARS-COV-2 Vaccination 04/15/2019, 05/08/2019, 12/27/2019   PNEUMOCOCCAL CONJUGATE-20 08/10/2021   Tdap 01/24/2014    Past Medical History:  Diagnosis Date   Allergy    Arthritis    Chicken pox    Endometriosis    GERD (gastroesophageal reflux disease)    Heartburn    History of fainting spells of unknown cause    History of sun-damaged skin 04/26/2014   Actinic keratosis.  Solar elastosis.  Benign nevus of right posterior neck.   HTN (hypertension)    Muscle weakness 01/2017   Patient reports being hospitalized for bilateral upper extremity weakness with negative stroke work-up.  Negative EEG.  Self resolved   Osteomyelitis ([place]) 12/2016   left ankle/foot. Patient had multiple surgeries and treated with antibiotics.   Seasonal allergies    Toxic metabolic encephalopathy 80/0349   Extensive negative work-up reported by PCP records.  Thought to be secondary to IV antibiotics for osteomyelitis.   Allergies  Allergen Reactions   Oxycodone Other (See Comments)    hallucination   Heparin Other (See Comments)    hallucination   Past Surgical History:  Procedure Laterality Date   BUNIONECTOMY  10/2016   Patient reports multiple postsurgical infections October/November and December 2018.   COLONOSCOPY  03/31/2012   Nonbleeding internal hemorrhoids otherwise normal exam.  Repeat in 10 years.  HERNIA REPAIR  1962   LAPAROSCOPIC ENDOMETRIOSIS FULGURATION  1986   RHINOPLASTY     Completed for deviated septum.   WISDOM TOOTH EXTRACTION     Family History  Problem Relation Age of Onset   Arthritis Mother    Hearing loss Mother    Heart attack Mother    Miscarriages / Stillbirths Mother    Hyperlipidemia Mother    Breast cancer Mother 33   Alcohol abuse Father    Diabetes Father    Hypertension Father    Stroke Father    Diabetes Sister    Early death Sister    Heart disease Sister    Stroke Sister    Arthritis  Maternal Grandmother    Hypertension Maternal Grandmother    Diabetes Maternal Grandfather    Early death Maternal Grandfather    Hyperlipidemia Maternal Grandfather    Hypertension Maternal Grandfather    Heart attack Maternal Grandfather    Heart attack Paternal Grandmother    Early death Paternal Grandfather    Heart attack Paternal Grandfather    Depression Sister    Ovarian cancer Maternal Aunt    Colon cancer Neg Hx    Social History   Social History Narrative   Marital status/children/pets: Married.  No children.   Education/employment: Associates degree, retired Airline pilot.   Safety:      -smoke alarm in the home:Yes     - wears seatbelt: Yes     - Feels safe in their relationships: Yes    Allergies as of 01/28/2022       Reactions   Oxycodone Other (See Comments)   hallucination   Heparin Other (See Comments)   hallucination        Medication List        Accurate as of January 28, 2022  9:51 AM. If you have any questions, ask your nurse or doctor.          aspirin EC 81 MG tablet Take 81 mg by mouth daily.   Calcium 250 MG Caps Take 1 capsule by mouth daily.   cetirizine 10 MG tablet Commonly known as: ZYRTEC Take 1 tablet (10 mg total) by mouth at bedtime.   clobetasol 0.05 % external solution Commonly known as: TEMOVATE Apply topically.   famotidine 20 MG tablet Commonly known as: PEPCID Take 1 tablet (20 mg total) by mouth 2 (two) times daily.   Fish Oil 1000 MG Caps Take 1 capsule by mouth daily at 2 PM.   ipratropium 0.06 % nasal spray Commonly known as: ATROVENT Place 2 sprays into both nostrils 4 (four) times daily.   losartan-hydrochlorothiazide 50-12.5 MG tablet Commonly known as: HYZAAR Take 1 tablet by mouth daily.   MULTIVITAMIN ADULT PO Take 1 tablet by mouth daily.   pantoprazole 40 MG tablet Commonly known as: PROTONIX Take 1 tablet by mouth twice daily   PROBIOTIC ADVANCED PO Take by mouth daily.   Quercetin  500 MG Caps Take by mouth.   VITAMIN D PO Take 2 each by mouth. gummies        All past medical history, surgical history, allergies, family history, immunizations andmedications were updated in the EMR today and reviewed under the history and medication portions of their EMR.     No results found for this or any previous visit (from the past 2160 hour(s)).  MM 3D SCREEN BREAST BILATERAL Result Date: 07/09/2020 RECOMMENDATION: Screening mammogram in one year. (Code:SM-B-01Y) BI-RADS CATEGORY  1: Negative. Electronically Signed  By: Lajean Manes M.D.   On: 07/09/2020 11:57    ROS 14 pt review of systems performed and negative (unless mentioned in an HPI)  Objective: BP 127/84   Pulse 67   Temp 98.2 F (36.8 C)   Wt 125 lb (56.7 kg)   SpO2 98%   BMI 22.86 kg/m  Physical Exam Vitals and nursing note reviewed.  Constitutional:      General: She is not in acute distress.    Appearance: Normal appearance. She is normal weight. She is not ill-appearing or toxic-appearing.  HENT:     Head: Normocephalic and atraumatic.  Eyes:     Extraocular Movements: Extraocular movements intact.     Conjunctiva/sclera: Conjunctivae normal.     Pupils: Pupils are equal, round, and reactive to light.  Cardiovascular:     Rate and Rhythm: Normal rate and regular rhythm.     Heart sounds: No murmur heard. Pulmonary:     Effort: Pulmonary effort is normal.     Breath sounds: Normal breath sounds.  Musculoskeletal:     Right lower leg: No edema.     Left lower leg: No edema.  Neurological:     Mental Status: She is alert and oriented to person, place, and time. Mental status is at baseline.  Psychiatric:        Mood and Affect: Mood normal.        Behavior: Behavior normal.        Thought Content: Thought content normal.        Judgment: Judgment normal.        No results found.  Assessment/plan: Jaklyn Alen is a 67 y.o. female present for  [employer] Hypertension: Stable Continue losartan-HCTZ- since she has lost weight she may find she needs less BP med. Encouraged her to 1/2 dose if dizziness and BP lower than 371 systolic Continue baby aspirin Routine exercise and healthy diet recommended. Follow-up 5.5 months   Osteoarthritis, unspecified osteoarthritis type, unspecified site avoid NSAIDs with her history of GERD and elevated liver enzymes Discussed turmeric supplement Aspercreme and capsaicin for carpometacarpal joints.   GERD without esophagitis Protonix 40 mg twice daily-medication managed by GI Dr. Tarri Glenn. Continue  Pepcid 20 mg twice daily  Osteopenia, unspecified location -continue vit d - elevated alk phos> followed by endo now.   Allergic rhinitis: Continue zyrtec nightly Continue atrovent prn  Return in about 28 weeks (around 08/12/2022) for cpe (20 min), Routine chronic condition follow-up.  No orders of the defined types were placed in this encounter.   No orders of the defined types were placed in this encounter.  Meds ordered this encounter  Medications   ipratropium (ATROVENT) 0.06 % nasal spray    Sig: Place 2 sprays into both nostrils 4 (four) times daily.    Dispense:  15 mL    Refill:  5   losartan-hydrochlorothiazide (HYZAAR) 50-12.5 MG tablet    Sig: Take 1 tablet by mouth daily.    Dispense:  90 tablet    Refill:  1   Referral Orders  No referral(s) requested today     Electronically signed by: Howard Pouch, [place]

## 2022-01-29 DIAGNOSIS — M21611 Bunion of right foot: Secondary | ICD-10-CM | POA: Diagnosis not present

## 2022-01-29 DIAGNOSIS — L84 Corns and callosities: Secondary | ICD-10-CM | POA: Diagnosis not present

## 2022-01-29 DIAGNOSIS — M24574 Contracture, right foot: Secondary | ICD-10-CM | POA: Diagnosis not present

## 2022-01-29 DIAGNOSIS — M2011 Hallux valgus (acquired), right foot: Secondary | ICD-10-CM | POA: Diagnosis not present

## 2022-01-29 DIAGNOSIS — M79671 Pain in right foot: Secondary | ICD-10-CM | POA: Diagnosis not present

## 2022-01-29 DIAGNOSIS — M2041 Other hammer toe(s) (acquired), right foot: Secondary | ICD-10-CM | POA: Diagnosis not present

## 2022-01-29 IMAGING — MG MM DIGITAL SCREENING BILAT W/ TOMO AND CAD
6 of 10 series · 6 of 30 positions shown · non-contrast
Comparison: Previous exam(s).

CLINICAL DATA: Screening.

EXAM:
DIGITAL SCREENING BILATERAL MAMMOGRAM WITH TOMOSYNTHESIS AND CAD
TECHNIQUE: Bilateral screening digital craniocaudal and mediolateral oblique
mammograms were obtained. Bilateral screening digital breast
tomosynthesis was performed. The images were evaluated with
computer-aided detection.

[L CC synth-2D (1 of 2)]
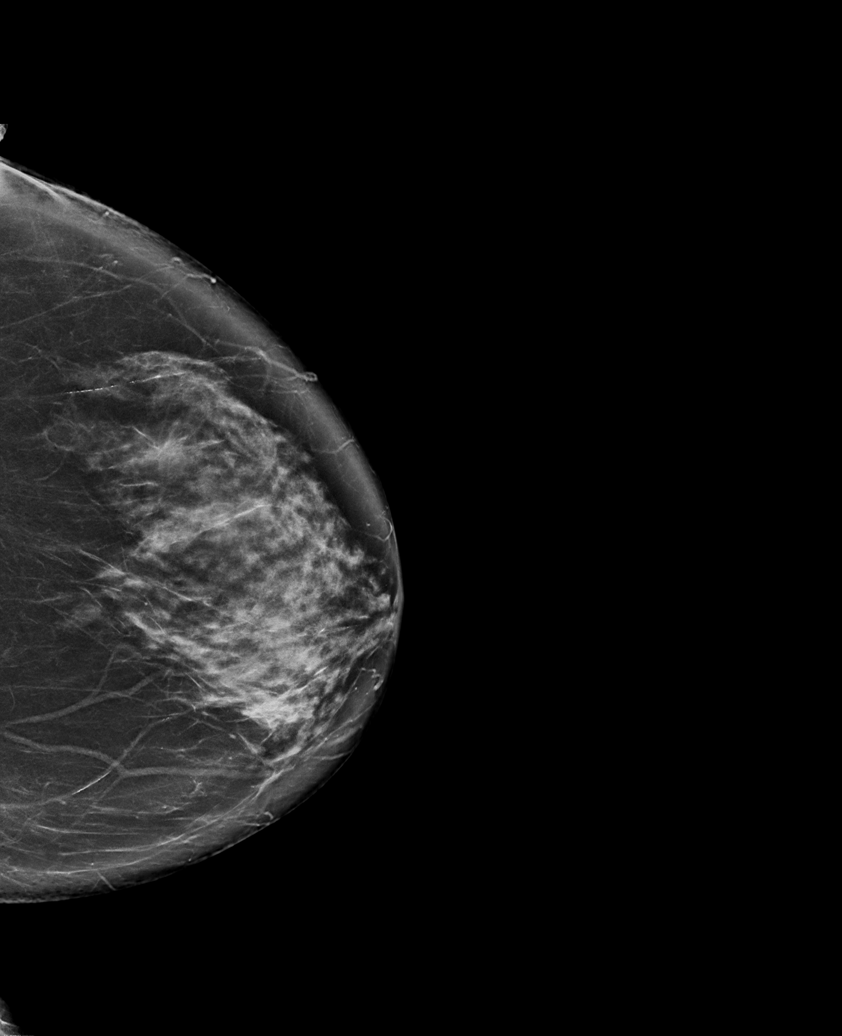

[L MLO synth-2D]
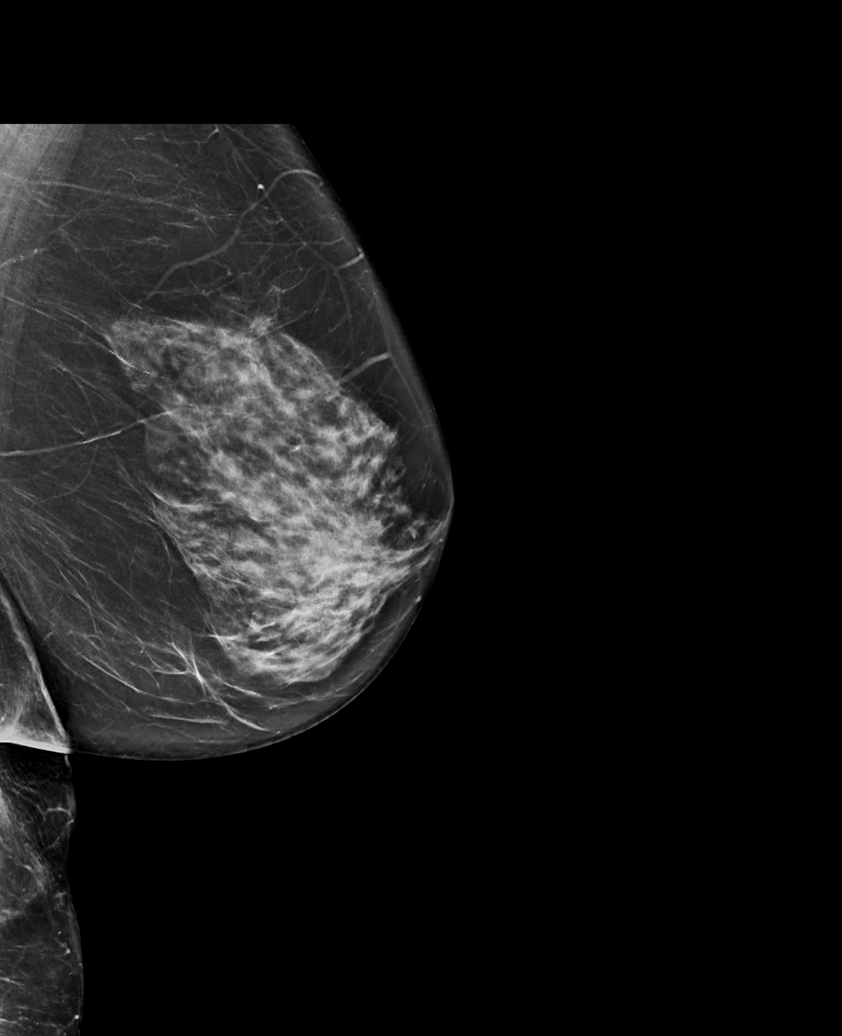

[L CC synth-2D (2 of 2)]
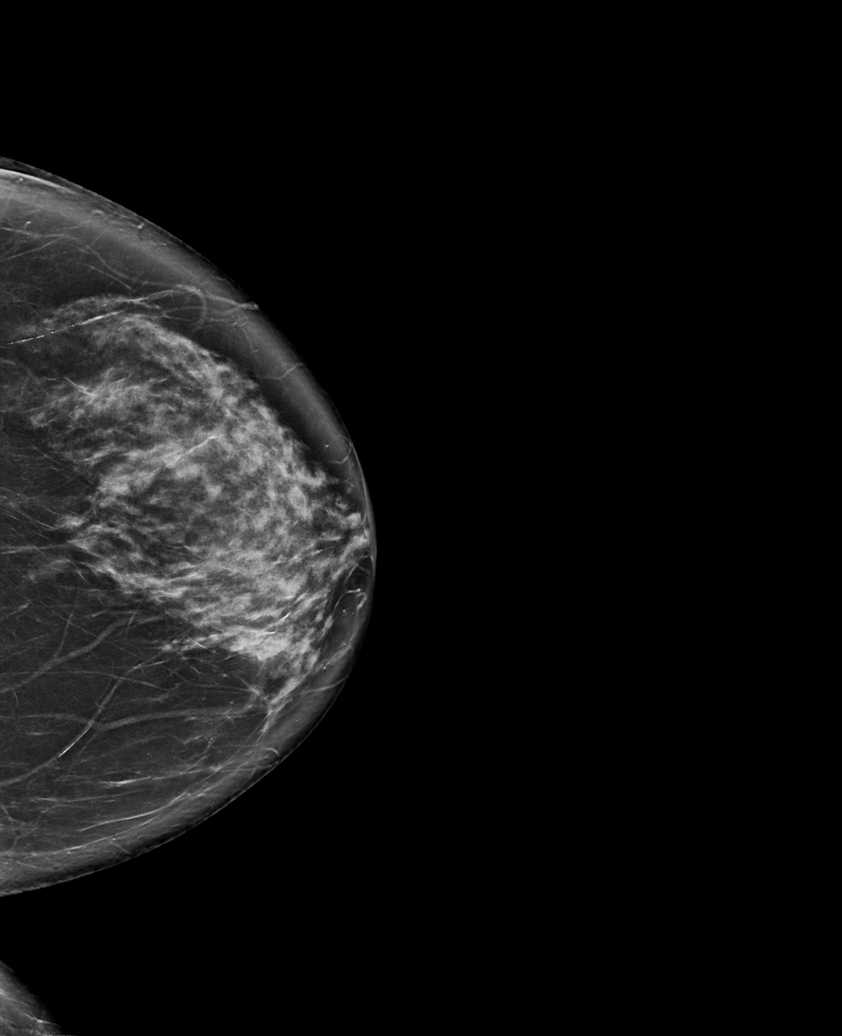

[R MLO synth-2D]
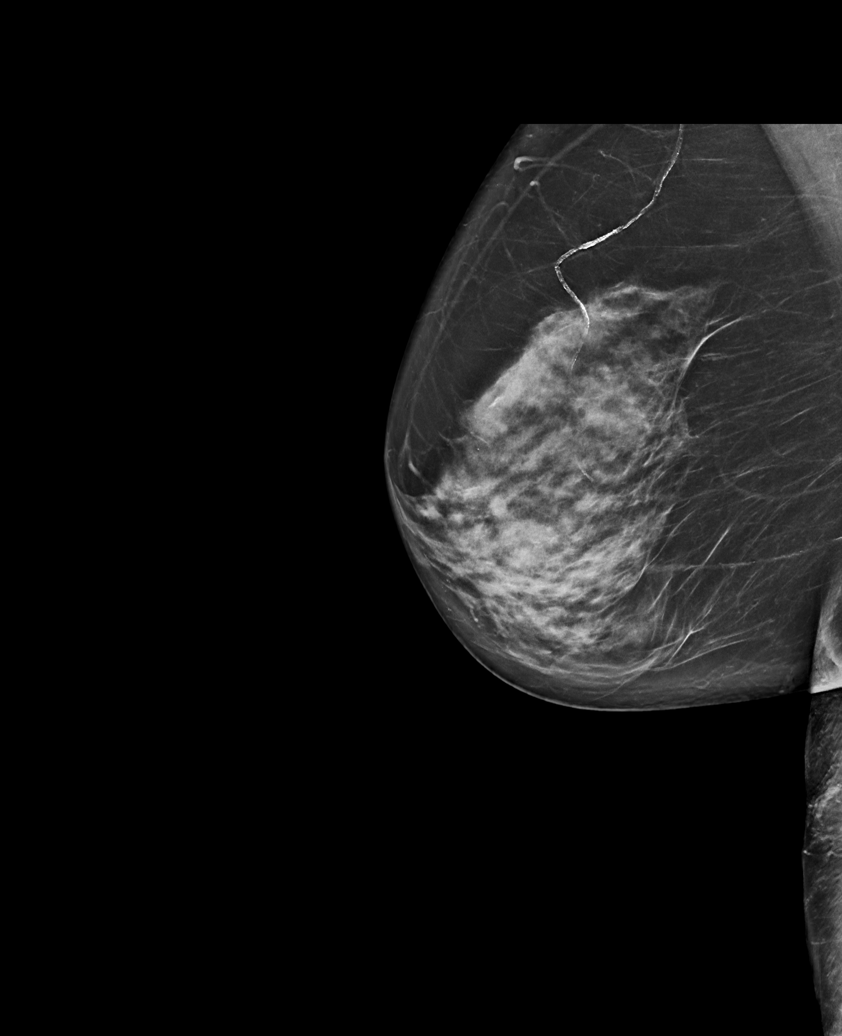

[R CC synth-2D]
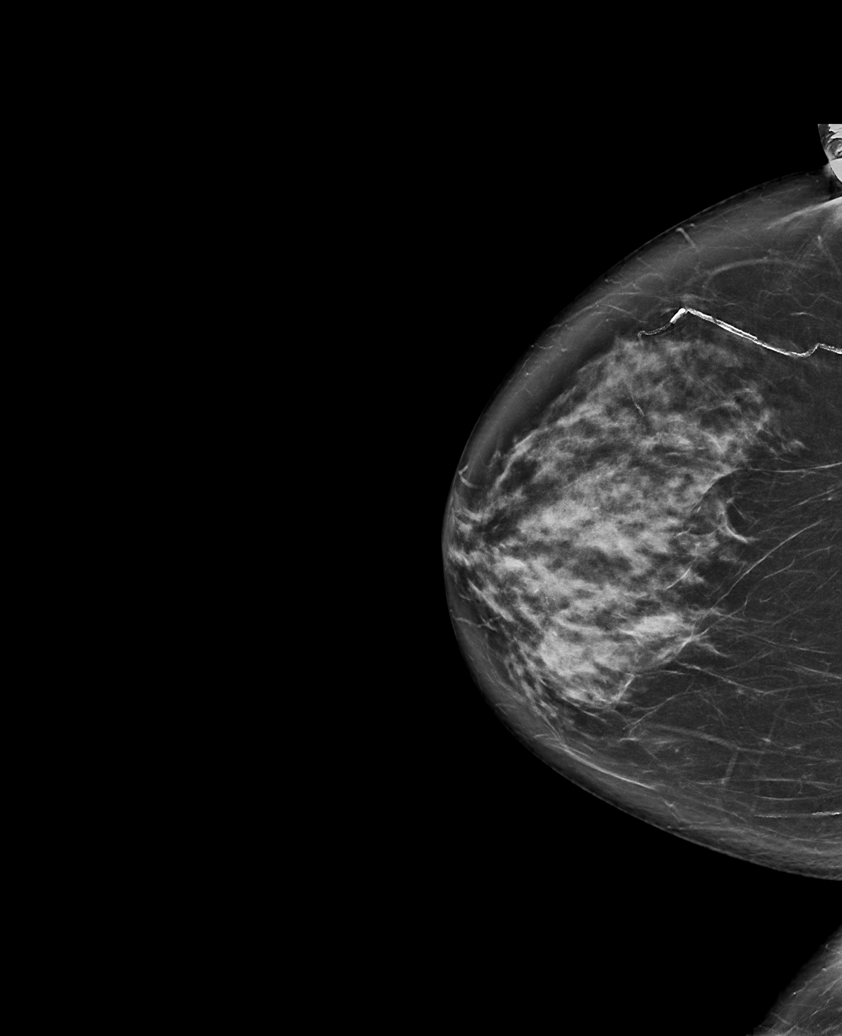

[L CC tomo · tomo slice 46/91.0]
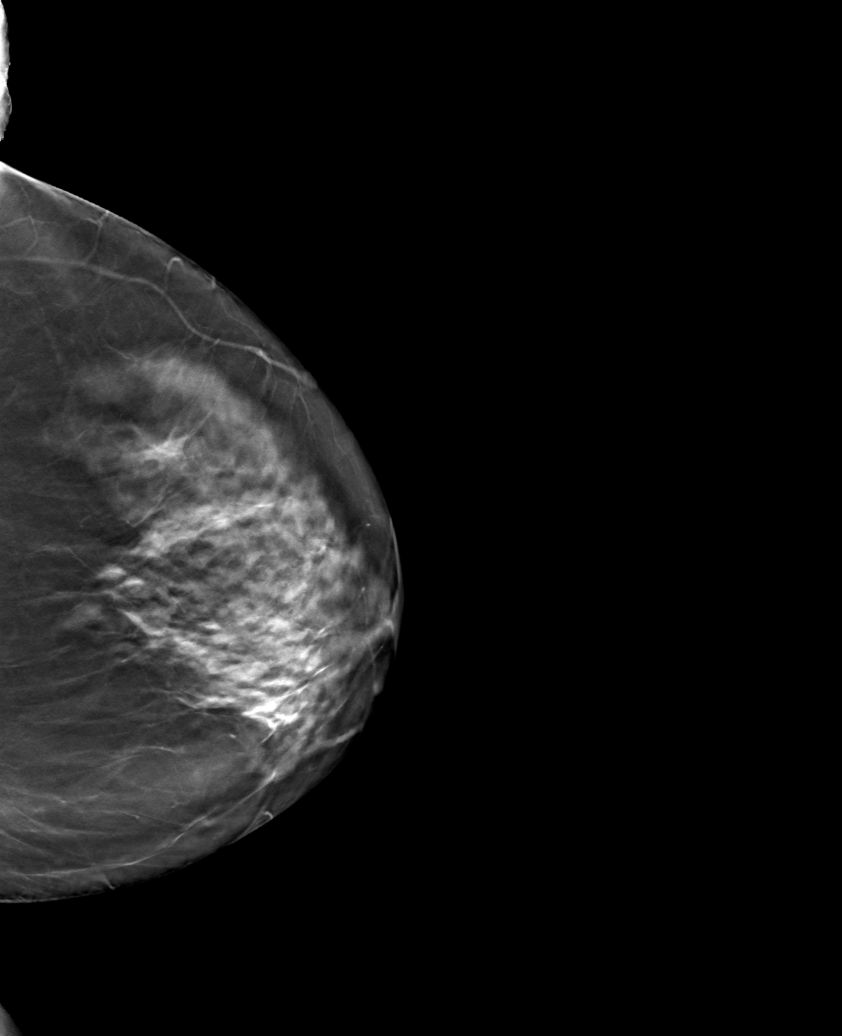

[6 of 30 positions shown; findings below may reference images not displayed]

ACR Breast Density Category c: The breast tissue is heterogeneously
dense, which may obscure small masses.
FINDINGS: There are no findings suspicious for malignancy. The images were
evaluated with computer-aided detection.
IMPRESSION: No mammographic evidence of malignancy. A result letter of this
screening mammogram will be mailed directly to the patient.

RECOMMENDATION:
Screening mammogram in one year. (Code:T4-5-GWO)

BI-RADS CATEGORY  1: Negative.

## 2022-02-05 DIAGNOSIS — R69 Illness, unspecified: Secondary | ICD-10-CM | POA: Diagnosis not present

## 2022-02-16 DIAGNOSIS — M24574 Contracture, right foot: Secondary | ICD-10-CM | POA: Diagnosis not present

## 2022-02-16 DIAGNOSIS — D1631 Benign neoplasm of short bones of right lower limb: Secondary | ICD-10-CM | POA: Diagnosis not present

## 2022-02-16 DIAGNOSIS — L84 Corns and callosities: Secondary | ICD-10-CM | POA: Diagnosis not present

## 2022-02-16 DIAGNOSIS — M2041 Other hammer toe(s) (acquired), right foot: Secondary | ICD-10-CM | POA: Diagnosis not present

## 2022-02-16 DIAGNOSIS — K219 Gastro-esophageal reflux disease without esophagitis: Secondary | ICD-10-CM | POA: Diagnosis not present

## 2022-02-16 DIAGNOSIS — Z888 Allergy status to other drugs, medicaments and biological substances status: Secondary | ICD-10-CM | POA: Diagnosis not present

## 2022-02-16 DIAGNOSIS — M1990 Unspecified osteoarthritis, unspecified site: Secondary | ICD-10-CM | POA: Diagnosis not present

## 2022-02-16 DIAGNOSIS — M21611 Bunion of right foot: Secondary | ICD-10-CM | POA: Diagnosis not present

## 2022-02-16 DIAGNOSIS — Z79899 Other long term (current) drug therapy: Secondary | ICD-10-CM | POA: Diagnosis not present

## 2022-02-16 DIAGNOSIS — Z885 Allergy status to narcotic agent status: Secondary | ICD-10-CM | POA: Diagnosis not present

## 2022-02-16 DIAGNOSIS — M2011 Hallux valgus (acquired), right foot: Secondary | ICD-10-CM | POA: Diagnosis not present

## 2022-02-16 DIAGNOSIS — I10 Essential (primary) hypertension: Secondary | ICD-10-CM | POA: Diagnosis not present

## 2022-02-16 DIAGNOSIS — Z7982 Long term (current) use of aspirin: Secondary | ICD-10-CM | POA: Diagnosis not present

## 2022-02-24 DIAGNOSIS — Z4789 Encounter for other orthopedic aftercare: Secondary | ICD-10-CM | POA: Diagnosis not present

## 2022-02-24 DIAGNOSIS — Z1330 Encounter for screening examination for mental health and behavioral disorders, unspecified: Secondary | ICD-10-CM | POA: Diagnosis not present

## 2022-03-03 DIAGNOSIS — Z4789 Encounter for other orthopedic aftercare: Secondary | ICD-10-CM | POA: Diagnosis not present

## 2022-03-03 DIAGNOSIS — Z981 Arthrodesis status: Secondary | ICD-10-CM | POA: Diagnosis not present

## 2022-03-03 DIAGNOSIS — Z967 Presence of other bone and tendon implants: Secondary | ICD-10-CM | POA: Diagnosis not present

## 2022-03-16 ENCOUNTER — Encounter: Payer: Self-pay | Admitting: Gastroenterology

## 2022-04-01 DIAGNOSIS — Z4789 Encounter for other orthopedic aftercare: Secondary | ICD-10-CM | POA: Diagnosis not present

## 2022-05-04 DIAGNOSIS — L570 Actinic keratosis: Secondary | ICD-10-CM | POA: Diagnosis not present

## 2022-05-05 ENCOUNTER — Ambulatory Visit
Admission: RE | Admit: 2022-05-05 | Discharge: 2022-05-05 | Disposition: A | Discharge status: Home / Self Care | Payer: Medicare HMO | Source: Ambulatory Visit | Attending: Family Medicine | Admitting: Family Medicine

## 2022-05-05 DIAGNOSIS — Z780 Asymptomatic menopausal state: Secondary | ICD-10-CM | POA: Diagnosis not present

## 2022-05-05 DIAGNOSIS — M8589 Other specified disorders of bone density and structure, multiple sites: Secondary | ICD-10-CM | POA: Diagnosis not present

## 2022-05-05 DIAGNOSIS — M8580 Other specified disorders of bone density and structure, unspecified site: Secondary | ICD-10-CM

## 2022-05-06 ENCOUNTER — Telehealth: Payer: Self-pay | Admitting: Family Medicine

## 2022-05-06 NOTE — Telephone Encounter (Signed)
Spoke with patient regarding results/recommendations.  

## 2022-05-06 NOTE — Telephone Encounter (Signed)
Please inform patient her bone density result shows mild decrease in bone density from last image.  She is still in the range of osteopenia.  No prescribed medications are needed at this time.   score of -1.8.  Was -1.5    Basic recommendations for osteopenia 1.) Drink alcohol in moderation only 2.) Decrease caffeine consumption to no  More than 2.5 cups of coffee a day  3.) Exercise: weight bearing (walking counts), strength and balance training. 4.) No smoking.  5.) Sunlight/Ultraviolet light exposure 30 minutes a day/5 days a week. 6.)  Maintain adequate levels of vitamin D and calcium, which she has done so by last labs.  Ensure that she is getting 1000 to 1200 mg of calcium a day, by diet and supplement combined

## 2022-08-03 ENCOUNTER — Ambulatory Visit: Payer: Medicare HMO | Admitting: Family Medicine

## 2022-08-04 ENCOUNTER — Ambulatory Visit: Payer: Medicare HMO | Admitting: Family Medicine

## 2022-08-16 ENCOUNTER — Ambulatory Visit: Payer: Medicare HMO | Admitting: Family Medicine

## 2022-08-31 ENCOUNTER — Encounter: Payer: Self-pay | Admitting: Family Medicine

## 2022-08-31 ENCOUNTER — Ambulatory Visit (INDEPENDENT_AMBULATORY_CARE_PROVIDER_SITE_OTHER): Payer: Medicare HMO | Admitting: Family Medicine

## 2022-08-31 VITALS — BP 118/81 | HR 65 | Temp 97.9°F | Ht 62.25 in | Wt 124.8 lb

## 2022-08-31 DIAGNOSIS — Z0000 Encounter for general adult medical examination without abnormal findings: Secondary | ICD-10-CM

## 2022-08-31 DIAGNOSIS — R1310 Dysphagia, unspecified: Secondary | ICD-10-CM | POA: Diagnosis not present

## 2022-08-31 DIAGNOSIS — Z5181 Encounter for therapeutic drug level monitoring: Secondary | ICD-10-CM

## 2022-08-31 DIAGNOSIS — Z79899 Other long term (current) drug therapy: Secondary | ICD-10-CM | POA: Diagnosis not present

## 2022-08-31 DIAGNOSIS — Z1211 Encounter for screening for malignant neoplasm of colon: Secondary | ICD-10-CM

## 2022-08-31 DIAGNOSIS — N3946 Mixed incontinence: Secondary | ICD-10-CM

## 2022-08-31 DIAGNOSIS — Z13220 Encounter for screening for lipoid disorders: Secondary | ICD-10-CM | POA: Diagnosis not present

## 2022-08-31 DIAGNOSIS — K222 Esophageal obstruction: Secondary | ICD-10-CM

## 2022-08-31 DIAGNOSIS — M8589 Other specified disorders of bone density and structure, multiple sites: Secondary | ICD-10-CM

## 2022-08-31 DIAGNOSIS — I10 Essential (primary) hypertension: Secondary | ICD-10-CM | POA: Diagnosis not present

## 2022-08-31 DIAGNOSIS — K219 Gastro-esophageal reflux disease without esophagitis: Secondary | ICD-10-CM | POA: Diagnosis not present

## 2022-08-31 DIAGNOSIS — R748 Abnormal levels of other serum enzymes: Secondary | ICD-10-CM | POA: Diagnosis not present

## 2022-08-31 DIAGNOSIS — Z803 Family history of malignant neoplasm of breast: Secondary | ICD-10-CM

## 2022-08-31 DIAGNOSIS — Z131 Encounter for screening for diabetes mellitus: Secondary | ICD-10-CM | POA: Diagnosis not present

## 2022-08-31 MED ORDER — IPRATROPIUM BROMIDE 0.06 % NA SOLN
2.0000 | Freq: Four times a day (QID) | NASAL | 5 refills | Status: DC
Start: 2022-08-31 — End: 2023-02-15

## 2022-08-31 MED ORDER — PANTOPRAZOLE SODIUM 40 MG PO TBEC: 40.0000 mg | DELAYED_RELEASE_TABLET | Freq: Two times a day (BID) | ORAL | 3 refills | Status: DC

## 2022-08-31 MED ORDER — FAMOTIDINE 20 MG PO TABS
20.0000 mg | ORAL_TABLET | Freq: Two times a day (BID) | ORAL | 3 refills | Status: DC
Start: 2022-08-31 — End: 2023-03-21

## 2022-08-31 MED ORDER — LOSARTAN POTASSIUM-HCTZ 50-12.5 MG PO TABS
1.0000 | ORAL_TABLET | Freq: Every day | ORAL | 1 refills | Status: DC
Start: 2022-08-31 — End: 2023-02-14

## 2022-08-31 MED ORDER — CETIRIZINE HCL 10 MG PO TABS: 10.0000 mg | ORAL_TABLET | Freq: Every day | ORAL | 3 refills | Status: DC

## 2022-08-31 NOTE — Progress Notes (Signed)
Patient ID: Jasmine Lee, female  DOB: 11-05-1955, 67 y.o.   MRN: [id number] Patient Care Team    Relationship Specialty Notifications Start End  Natalia Leatherwood, DO PCP - General Family Medicine  11/17/18   Vernia Buff, MD Referring Physician   04/26/19   Powers, Despina Pole, DPM Referring Physician Surgery  04/26/19   Shamleffer, Konrad Dolores, MD Consulting Physician Endocrinology  11/16/19   Nolon Nations, DPM Referring Physician Podiatry  01/28/22   Kathrynn Running, [place]  Dermatology  01/28/22   Tressia Danas, MD Consulting Physician Gastroenterology  08/31/22     Chief Complaint  Patient presents with   Annual Exam    Pt is fasting     Subjective:  Jasmine Lee is a 67 y.o.  Female  present for CPE/chronic condition management All past medical history, surgical history, allergies, family history, immunizations, medications and social history were updated in the electronic medical record today. All recent labs, ED visits and hospitalizations within the last year were reviewed.  Health maintenance:  Colonoscopy: Colonoscopy completed in 03/31/2012.  10-year follow-up per patient.-Patient established with [facility]- refer to Dr. [name] (was Dr. Orvan Falconer pt) Mammogram: completed: 08/2021> ordered [facility].  Patient has mammo scheduled 09/20/2022 Immunizations: tdap UTD 2015, Influenza (encouraged yearly), Shingrix declined. PNA20 completed today  infectious disease screening: HIV and Hep C completed DEXA: Estrogen deficient.  osteopenia 05/05/2022.  [facility] (-[facility]) Patient has a Dental home. Hospitalizations/ED visits: Reviewed  Hypertension: Pt reports compliance with losartan-HCTZ 50-12.5 mg.  Patient denies chest pain, shortness of breath, dizziness or lower extremity edema.   Pt takes a  daily baby ASA. Pt is not prescribed statin.   Osteoarthritis, unspecified osteoarthritis type, unspecified site Patient feels her arthritis is flaring.  She has a history  of GERD and does not tolerate NSAIDs very well.  She reports her left elbow and bilateral carpometacarpal joints are the most painful.  encouraged topical OTC treatments in the past.   GERD without esophagitis Patient is prescribed Protonix 40 mg twice daily by Dr. Olam Idler.  She reports compliance with Pepcid 20 mg twice daily.       08/31/2022    8:08 AM 10/26/2021    1:47 PM 08/10/2021    9:31 AM 08/11/2020    9:10 AM 04/24/2019    8:07 AM  Depression screen PHQ 2/9  Decreased Interest 0 0 0 0 0  Down, Depressed, Hopeless 0 0 0 0 1  PHQ - 2 Score 0 0 0 0 1       No data to display           Immunization History  Administered Date(s) Administered   PFIZER(Purple Top)SARS-COV-2 Vaccination 04/15/2019, 05/08/2019, 12/27/2019   PNEUMOCOCCAL CONJUGATE-20 08/10/2021   Tdap 01/24/2014    Past Medical History:  Diagnosis Date   Allergy    Arthritis    Chicken pox    Endometriosis    GERD (gastroesophageal reflux disease)    Heartburn    History of fainting spells of unknown cause    History of sun-damaged skin 04/26/2014   Actinic keratosis.  Solar elastosis.  Benign nevus of right posterior neck.   HTN (hypertension)    Muscle weakness 01/2017   Patient reports being hospitalized for bilateral upper extremity weakness with negative stroke work-up.  Negative EEG.  Self resolved   Osteomyelitis (HCC) 12/2016   left ankle/foot. Patient had multiple surgeries and treated with antibiotics.   Seasonal allergies  Toxic metabolic encephalopathy 01/2017   Extensive negative work-up reported by PCP records.  Thought to be secondary to IV antibiotics for osteomyelitis.   Allergies  Allergen Reactions   Oxycodone Other (See Comments)    hallucination   Heparin Other (See Comments)    hallucination   Past Surgical History:  Procedure Laterality Date   BUNIONECTOMY  10/2016   Patient reports multiple postsurgical infections October/November and December 2018.   COLONOSCOPY   03/31/2012   Nonbleeding internal hemorrhoids otherwise normal exam.  Repeat in 10 years.   HERNIA REPAIR  1962   LAPAROSCOPIC ENDOMETRIOSIS FULGURATION  1986   RHINOPLASTY     Completed for deviated septum.   WISDOM TOOTH EXTRACTION     Family History  Problem Relation Age of Onset   Arthritis Mother    Hearing loss Mother    Heart attack Mother    Miscarriages / Stillbirths Mother    Hyperlipidemia Mother    Breast cancer Mother 38   Alcohol abuse Father    Diabetes Father    Hypertension Father    Stroke Father    Diabetes Sister    Early death Sister    Heart disease Sister    Stroke Sister    Arthritis Maternal Grandmother    Hypertension Maternal Grandmother    Diabetes Maternal Grandfather    Early death Maternal Grandfather    Hyperlipidemia Maternal Grandfather    Hypertension Maternal Grandfather    Heart attack Maternal Grandfather    Heart attack Paternal Grandmother    Early death Paternal Grandfather    Heart attack Paternal Grandfather    Depression Sister    Ovarian cancer Maternal Aunt    Colon cancer Neg Hx    Social History   Social History Narrative   Marital status/children/pets: Married.  No children.   Education/employment: Associates degree, retired Counsellor.   Safety:      -smoke alarm in the home:Yes     - wears seatbelt: Yes     - Feels safe in their relationships: Yes    Allergies as of 08/31/2022       Reactions   Oxycodone Other (See Comments)   hallucination   Heparin Other (See Comments)   hallucination        Medication List        Accurate as of August 31, 2022  8:35 AM. If you have any questions, ask your nurse or doctor.          STOP taking these medications    VITAMIN D PO Stopped by: Felix Pacini       TAKE these medications    aspirin EC 81 MG tablet Take 81 mg by mouth daily.   Calcium 250 MG Caps Take 1 capsule by mouth daily.   cetirizine 10 MG tablet Commonly known as: ZYRTEC Take 1  tablet (10 mg total) by mouth at bedtime.   clobetasol 0.05 % external solution Commonly known as: TEMOVATE Apply topically.   famotidine 20 MG tablet Commonly known as: PEPCID Take 1 tablet (20 mg total) by mouth 2 (two) times daily.   Fish Oil 1000 MG Caps Take 1 capsule by mouth daily at 2 PM.   ipratropium 0.06 % nasal spray Commonly known as: ATROVENT Place 2 sprays into both nostrils 4 (four) times daily.   losartan-hydrochlorothiazide 50-12.5 MG tablet Commonly known as: HYZAAR Take 1 tablet by mouth daily.   MULTIVITAMIN ADULT PO Take 1 tablet by mouth daily.  pantoprazole 40 MG tablet Commonly known as: PROTONIX Take 1 tablet (40 mg total) by mouth 2 (two) times daily.   PROBIOTIC ADVANCED PO Take by mouth daily.   Quercetin 500 MG Caps Take by mouth.        All past medical history, surgical history, allergies, family history, immunizations andmedications were updated in the EMR today and reviewed under the history and medication portions of their EMR.     No results found for this or any previous visit (from the past 2160 hour(s)).  MM 3D SCREEN BREAST BILATERAL Result Date: 07/09/2020 RECOMMENDATION: Screening mammogram in one year. (Code:SM-B-01Y) BI-RADS CATEGORY  1: Negative. Electronically Signed   By: Amie Portland M.D.   On: 07/09/2020 11:57    ROS 14 pt review of systems performed and negative (unless mentioned in an HPI)  Objective: BP 118/81   Pulse 65   Temp 97.9 F (36.6 C)   Ht 5' 2.25" (1.581 m)   Wt 124 lb 12.8 oz (56.6 kg)   SpO2 98%   BMI 22.64 kg/m  Physical Exam Vitals and nursing note reviewed.  Constitutional:      General: She is not in acute distress.    Appearance: Normal appearance. She is not ill-appearing or toxic-appearing.  HENT:     Head: Normocephalic and atraumatic.     Right Ear: Tympanic membrane, ear canal and external ear normal. There is no impacted cerumen.     Left Ear: Tympanic membrane, ear canal  and external ear normal. There is no impacted cerumen.     Nose: No congestion or rhinorrhea.     Mouth/Throat:     Mouth: Mucous membranes are moist.     Pharynx: Oropharynx is clear. No oropharyngeal exudate or posterior oropharyngeal erythema.  Eyes:     General: No scleral icterus.       Right eye: No discharge.        Left eye: No discharge.     Extraocular Movements: Extraocular movements intact.     Conjunctiva/sclera: Conjunctivae normal.     Pupils: Pupils are equal, round, and reactive to light.  Cardiovascular:     Rate and Rhythm: Normal rate and regular rhythm.     Pulses: Normal pulses.     Heart sounds: Normal heart sounds. No murmur heard.    No friction rub. No gallop.  Pulmonary:     Effort: Pulmonary effort is normal. No respiratory distress.     Breath sounds: Normal breath sounds. No stridor. No wheezing, rhonchi or rales.  Chest:     Chest wall: No tenderness.  Abdominal:     General: Abdomen is flat. Bowel sounds are normal. There is no distension.     Palpations: Abdomen is soft. There is no mass.     Tenderness: There is no abdominal tenderness. There is no right CVA tenderness, left CVA tenderness, guarding or rebound.     Hernia: No hernia is present.  Musculoskeletal:        General: No swelling, tenderness or deformity. Normal range of motion.     Cervical back: Normal range of motion and neck supple. No rigidity or tenderness.     Right lower leg: No edema.     Left lower leg: No edema.  Lymphadenopathy:     Cervical: No cervical adenopathy.  Skin:    General: Skin is warm and dry.     Coloration: Skin is not jaundiced or pale.     Findings: No bruising, erythema,  lesion or rash.  Neurological:     General: No focal deficit present.     Mental Status: She is alert and oriented to person, place, and time. Mental status is at baseline.     Cranial Nerves: No cranial nerve deficit.     Sensory: No sensory deficit.     Motor: No weakness.      Coordination: Coordination normal.     Gait: Gait normal.     Deep Tendon Reflexes: Reflexes normal.  Psychiatric:        Mood and Affect: Mood normal.        Behavior: Behavior normal.        Thought Content: Thought content normal.        Judgment: Judgment normal.      No results found.  Assessment/plan: Manjit Stidd is a 67 y.o. female present for annual preventative exam and chronic condition management Hypertension: Stable Continue losartan-HCTZ- since she has lost weight she may find she needs less BP med. Encouraged her to 1/2 dose if dizziness and BP lower than 100 systolic Continue baby aspirin Routine exercise and healthy diet recommended.  - CBC with Differential/Platelet - Comprehensive metabolic panel - Hemoglobin A1c - TSH - Lipid panel Osteoarthritis, unspecified osteoarthritis type, unspecified site avoid NSAIDs with her history of GERD and elevated liver enzymes Discussed turmeric supplement Aspercreme and capsaicin for carpometacarpal joints.   GERD without esophagitis Continue Protonix 40 mg twice daily- took over script Continue Pepcid 20 mg twice daily - Vitamin D (25 hydroxy) - B12 and Folate Panel - Magnesium  Osteopenia, unspecified location DEXA up to date repeat due 04/2024.  T-score -1.8 - elevated alk phos> followed by endo now.   Allergic rhinitis: Continue zyrtec nightly Continue atrovent prn  Colon cancer screening - Ambulatory referral to Gastroenterology   Diabetes mellitus screening - Hemoglobin A1c   Lipid screening - Lipid panel  Elevated alkaline phosphatase level - Vitamin D (25 hydroxy) - Magnesium  Encounter for monitoring long-term proton pump inhibitor therapy - Vitamin D (25 hydroxy) - B12 and Folate Panel - Magnesium  1Dysphagia, unspecified type - pantoprazole (PROTONIX) 40 MG tablet; Take 1 tablet (40 mg total) by mouth 2 (two) times daily.  Dispense: 180 tablet; Refill: 3  Esophageal ring -  pantoprazole (PROTONIX) 40 MG tablet; Take 1 tablet (40 mg total) by mouth 2 (two) times daily.  Dispense: 180 tablet; Refill: 3  stress and urge urinary incontinence - Ambulatory referral to Gynecology Routine general medical examination at a health care facility Patient was encouraged to exercise greater than 150 minutes a week. Patient was encouraged to choose a diet filled with fresh fruits and vegetables, and lean meats. AVS provided to patient today for education/recommendation on gender specific health and safety maintenance. Colonoscopy: Colonoscopy completed in 03/31/2012.  10-year follow-up per patient.-Patient established with [facility]> Dr. Lavon Paganini Mammogram: completed: 08/2021> ordered [facility].  Patient has mammo scheduled 09/20/2022 Immunizations: tdap UTD 2015, Influenza (encouraged yearly), Shingrix declined. PNA20 completed today  infectious disease screening: HIV and Hep C doing patient agreeable to testing today. DEXA: Estrogen deficient.  osteopenia 05/05/2022.  [facility] (-1.8)  Return in about 24 weeks (around 02/15/2023) for Routine chronic condition follow-up.  Orders Placed This Encounter  Procedures   CBC with Differential/Platelet   Comprehensive metabolic panel   Hemoglobin A1c   TSH   Lipid panel   Vitamin D (25 hydroxy)   B12 and Folate Panel   Magnesium   Ambulatory referral to  Gastroenterology   Ambulatory referral to Gynecology    Orders Placed This Encounter  Procedures   CBC with Differential/Platelet   Comprehensive metabolic panel   Hemoglobin A1c   TSH   Lipid panel   Vitamin D (25 hydroxy)   B12 and Folate Panel   Magnesium   Ambulatory referral to Gastroenterology   Ambulatory referral to Gynecology   Meds ordered this encounter  Medications   losartan-hydrochlorothiazide (HYZAAR) 50-12.5 MG tablet    Sig: Take 1 tablet by mouth daily.    Dispense:  90 tablet    Refill:  1   famotidine (PEPCID) 20 MG tablet    Sig: Take 1 tablet  (20 mg total) by mouth 2 (two) times daily.    Dispense:  90 tablet    Refill:  3   pantoprazole (PROTONIX) 40 MG tablet    Sig: Take 1 tablet (40 mg total) by mouth 2 (two) times daily.    Dispense:  180 tablet    Refill:  3    Hold until pt request   cetirizine (ZYRTEC) 10 MG tablet    Sig: Take 1 tablet (10 mg total) by mouth at bedtime.    Dispense:  90 tablet    Refill:  3   ipratropium (ATROVENT) 0.06 % nasal spray    Sig: Place 2 sprays into both nostrils 4 (four) times daily.    Dispense:  15 mL    Refill:  5   Referral Orders         Ambulatory referral to Gastroenterology         Ambulatory referral to Gynecology       Electronically signed by: Felix Pacini, DO [facility]

## 2022-08-31 NOTE — Patient Instructions (Addendum)

## 2022-09-03 ENCOUNTER — Encounter: Payer: Self-pay | Admitting: Gastroenterology

## 2022-09-20 ENCOUNTER — Ambulatory Visit
Admission: RE | Admit: 2022-09-20 | Discharge: 2022-09-20 | Disposition: A | Discharge status: Home / Self Care | Payer: Medicare HMO | Source: Ambulatory Visit | Attending: Family Medicine | Admitting: Family Medicine

## 2022-09-20 DIAGNOSIS — Z1231 Encounter for screening mammogram for malignant neoplasm of breast: Secondary | ICD-10-CM

## 2022-10-15 DIAGNOSIS — L821 Other seborrheic keratosis: Secondary | ICD-10-CM | POA: Diagnosis not present

## 2022-10-15 DIAGNOSIS — L308 Other specified dermatitis: Secondary | ICD-10-CM | POA: Diagnosis not present

## 2022-10-15 DIAGNOSIS — L820 Inflamed seborrheic keratosis: Secondary | ICD-10-CM | POA: Diagnosis not present

## 2022-10-15 DIAGNOSIS — D229 Melanocytic nevi, unspecified: Secondary | ICD-10-CM | POA: Diagnosis not present

## 2022-10-15 DIAGNOSIS — Z872 Personal history of diseases of the skin and subcutaneous tissue: Secondary | ICD-10-CM | POA: Diagnosis not present

## 2022-10-15 DIAGNOSIS — L570 Actinic keratosis: Secondary | ICD-10-CM | POA: Diagnosis not present

## 2022-10-15 DIAGNOSIS — L814 Other melanin hyperpigmentation: Secondary | ICD-10-CM | POA: Diagnosis not present

## 2022-10-26 ENCOUNTER — Ambulatory Visit (AMBULATORY_SURGERY_CENTER): Payer: Medicare HMO

## 2022-10-26 ENCOUNTER — Encounter: Payer: Self-pay | Admitting: Gastroenterology

## 2022-10-26 VITALS — Ht 62.0 in | Wt 123.0 lb

## 2022-10-26 DIAGNOSIS — Z1211 Encounter for screening for malignant neoplasm of colon: Secondary | ICD-10-CM

## 2022-10-26 MED ORDER — NA SULFATE-K SULFATE-MG SULF 17.5-3.13-1.6 GM/177ML PO SOLN: 1.0000 | Freq: Once | ORAL | 0 refills | Status: AC

## 2022-10-26 NOTE — Progress Notes (Signed)
No egg or soy allergy known to patient  No issues known to pt with past sedation with any surgeries or procedures Patient denies ever being told they had issues or difficulty with intubation  No FH of Malignant Hyperthermia Pt is not on diet pills Pt is not on  home 02  Pt is not on blood thinners  Pt has some issues with constipation  No A fib or A flutter Have any cardiac testing pending--no Pt can ambulate indepenedently Pt denies use of chewing tobacco Discussed diabetic I weight loss medication holds Discussed NSAID holds Checked BMI Pt instructed to use Single[employer] or GoodRx for a price reduction on prep  Patient's chart reviewed by Cathlyn Parsons CNRA prior to previsit and patient appropriate for the [facility].  Pre visit completed and red dot placed by patient's name on their procedure day (on provider's schedule).

## 2022-11-11 ENCOUNTER — Encounter: Payer: Medicare HMO | Admitting: Gastroenterology

## 2022-11-16 ENCOUNTER — Other Ambulatory Visit: Payer: Self-pay

## 2022-11-16 ENCOUNTER — Encounter: Payer: Self-pay | Admitting: Gastroenterology

## 2022-11-17 ENCOUNTER — Ambulatory Visit: Payer: Medicare HMO

## 2022-11-17 VITALS — Wt 123.0 lb

## 2022-11-17 DIAGNOSIS — Z0000 Encounter for general adult medical examination without abnormal findings: Secondary | ICD-10-CM

## 2022-11-17 NOTE — Progress Notes (Signed)
Subjective:   Jasmine Lee is a 66 y.o. female who presents for Medicare Annual (Subsequent) preventive examination.  Visit Complete: Virtual I connected with  [name] on 11/17/22 by a audio enabled telemedicine application and verified that I am speaking with the correct person using two identifiers.  Patient Location: Home  Provider Location: Home Office  I discussed the limitations of evaluation and management by telemedicine. The patient expressed understanding and agreed to proceed.  Vital Signs: Because this visit was a virtual/telehealth visit, some criteria may be missing or patient reported. Any vitals not documented were not able to be obtained and vitals that have been documented are patient reported.  Patient Medicare AWV questionnaire was completed by the patient on 11/13/22; I have confirmed that all information answered by patient is correct and no changes since this date.  Cardiac Risk Factors include: advanced age (>40men, >50 women);hypertension     Objective:    Today's Vitals   11/17/22 1405  Weight: 123 lb (55.8 kg)   Body mass index is 22.5 kg/m.     10/26/2021    1:49 PM  Advanced Directives  Does Patient Have a Medical Advance Directive? No  Would patient like information on creating a medical advance directive? No - Patient declined    Current Medications (verified) Outpatient Encounter Medications as of 11/17/2022  Medication Sig   aspirin EC 81 MG tablet Take 81 mg by mouth daily.   Calcium 250 MG CAPS Take 1 capsule by mouth daily.   cetirizine (ZYRTEC) 10 MG tablet Take 1 tablet (10 mg total) by mouth at bedtime.   clobetasol (TEMOVATE) 0.05 % external solution Apply topically.   famotidine (PEPCID) 20 MG tablet Take 1 tablet (20 mg total) by mouth 2 (two) times daily.   ipratropium (ATROVENT) 0.06 % nasal spray Place 2 sprays into both nostrils 4 (four) times daily.   losartan-hydrochlorothiazide (HYZAAR) 50-12.5 MG tablet Take 1  tablet by mouth daily.   Multiple Vitamins-Minerals (MULTIVITAMIN ADULT PO) Take 1 tablet by mouth daily.   Omega-3 Fatty Acids (FISH OIL) 1000 MG CAPS Take 1 capsule by mouth daily at 2 PM.   pantoprazole (PROTONIX) 40 MG tablet Take 1 tablet (40 mg total) by mouth 2 (two) times daily.   Probiotic Product (PROBIOTIC ADVANCED PO) Take by mouth daily.   Quercetin 500 MG CAPS Take by mouth.   No facility-administered encounter medications on file as of 11/17/2022.    Allergies (verified) Oxycodone and Heparin   History: Past Medical History:  Diagnosis Date   Allergy    Arthritis    Chicken pox    Endometriosis    GERD (gastroesophageal reflux disease)    Heartburn    History of fainting spells of unknown cause    History of sun-damaged skin 04/26/2014   Actinic keratosis.  Solar elastosis.  Benign nevus of right posterior neck.   HTN (hypertension)    Muscle weakness 01/2017   Patient reports being hospitalized for bilateral upper extremity weakness with negative stroke work-up.  Negative EEG.  Self resolved   Osteomyelitis (HCC) 12/2016   left ankle/foot. Patient had multiple surgeries and treated with antibiotics.   Seasonal allergies    Toxic metabolic encephalopathy 01/2017   Extensive negative work-up reported by PCP records.  Thought to be secondary to IV antibiotics for osteomyelitis.   Past Surgical History:  Procedure Laterality Date   BUNIONECTOMY  10/2016   Patient reports multiple postsurgical infections October/November and December 2018.  COLONOSCOPY  03/31/2012   Nonbleeding internal hemorrhoids otherwise normal exam.  Repeat in 10 years.   ESOPHAGOGASTRODUODENOSCOPY ENDOSCOPY  2022   HERNIA REPAIR  1962   LAPAROSCOPIC ENDOMETRIOSIS FULGURATION  1986   RHINOPLASTY     Completed for deviated septum.   WISDOM TOOTH EXTRACTION     Family History  Problem Relation Age of Onset   Arthritis Mother    Hearing loss Mother    Heart attack Mother     Miscarriages / Stillbirths Mother    Hyperlipidemia Mother    Breast cancer Mother 50   Alcohol abuse Father    Diabetes Father    Hypertension Father    Stroke Father    Diabetes Sister    Early death Sister    Heart disease Sister    Stroke Sister    Depression Sister    Colon polyps Sister    Ovarian cancer Maternal Aunt    Arthritis Maternal Grandmother    Hypertension Maternal Grandmother    Diabetes Maternal Grandfather    Early death Maternal Grandfather    Hyperlipidemia Maternal Grandfather    Hypertension Maternal Grandfather    Heart attack Maternal Grandfather    Heart attack Paternal Grandmother    Early death Paternal Grandfather    Heart attack Paternal Grandfather    Colon cancer Neg Hx    Esophageal cancer Neg Hx    Rectal cancer Neg Hx    Stomach cancer Neg Hx    Social History   Socioeconomic History   Marital status: Married    Spouse name: Not on file   Number of children: Not on file   Years of education: Not on file   Highest education level: Associate degree: occupational, Scientist, product/process development, or vocational program  Occupational History   Not on file  Tobacco Use   Smoking status: Never   Smokeless tobacco: Never  Vaping Use   Vaping status: Never Used  Substance and Sexual Activity   Alcohol use: Yes    Alcohol/week: 14.0 standard drinks of alcohol    Types: 14 Standard drinks or equivalent per week    Comment: wine and liquor daily    Drug use: Never   Sexual activity: Not Currently    Comment: 1st intercourse 32 yo- 5 partners  Other Topics Concern   Not on file  Social History Narrative   Marital status/children/pets: Married.  No children.   Education/employment: Associates degree, retired Counsellor.   Safety:      -smoke alarm in the home:Yes     - wears seatbelt: Yes     - Feels safe in their relationships: Yes   Social Determinants of Health   Financial Resource Strain: Patient Declined (11/13/2022)   Overall Financial  Resource Strain (CARDIA)    Difficulty of Paying Living Expenses: Patient declined  Food Insecurity: Patient Declined (11/13/2022)   Hunger Vital Sign    Worried About Running Out of Food in the Last Year: Patient declined    Ran Out of Food in the Last Year: Patient declined  Transportation Needs: No Transportation Needs (11/13/2022)   PRAPARE - Administrator, Civil Service (Medical): No    Lack of Transportation (Non-Medical): No  Physical Activity: Insufficiently Active (11/13/2022)   Exercise Vital Sign    Days of Exercise per Week: 6 days    Minutes of Exercise per Session: 20 min  Stress: No Stress Concern Present (11/13/2022)   [employer] of Occupational Health - Occupational Stress  Questionnaire    Feeling of Stress : Not at all  Social Connections: Unknown (11/13/2022)   Social Connection and Isolation Panel [NHANES]    Frequency of Communication with Friends and Family: More than three times a week    Frequency of Social Gatherings with Friends and Family: Patient declined    Attends Religious Services: 1 to 4 times per year    Active Member of [employer] or Organizations: Patient declined    Attends Engineer, structural: Patient declined    Marital Status: Married    Tobacco Counseling Counseling given: Not Answered   Clinical Intake:  Pre-visit preparation completed: Yes  Pain : No/denies pain     BMI - recorded: 22.5 Nutritional Status: BMI of 19-24  Normal Diabetes: No  How often do you need to have someone help you when you read instructions, pamphlets, or other written materials from your doctor or pharmacy?: 2 - Rarely  Interpreter Needed?: No  Information entered by :: Lanier Ensign, LPN   Activities of Daily Living    11/13/2022    1:21 PM 11/02/2022    2:02 PM  In your present state of health, do you have any difficulty performing the following activities:  Hearing? 0 0  Vision? 0 0  Difficulty concentrating or  making decisions? 0 0  Walking or climbing stairs? 0 0  Dressing or bathing? 0 0  Doing errands, shopping? 0 0  Preparing Food and eating ? N N  Using the Toilet? N N  In the past six months, have you accidently leaked urine? Y Y  Comment at times   Do you have problems with loss of bowel control? N N  Managing your Medications? N N  Managing your Finances? N N  Housekeeping or managing your Housekeeping? N N    Patient Care Team: Natalia Leatherwood, DO as PCP - General (Family Medicine) Vernia Buff, MD as Referring Physician Powers, Despina Pole, DPM as Referring Physician (Surgery) Shamleffer, Konrad Dolores, MD as Consulting Physician (Endocrinology) Clarisa Kindred, Gerrit Friends, DPM as Referring Physician (Podiatry) Robbie Lis (Dermatology) Tressia Danas, MD (Inactive) as Consulting Physician (Gastroenterology)  Indicate any recent Medical Services you may have received from other than [facility] providers in the past year (date may be approximate).     Assessment:   This is a routine wellness examination for [place].  Hearing/Vision screen Hearing Screening - Comments:: Pt denies any hearing issues  Vision Screening - Comments:: Pt follows up with Dr Cherlynn Polo [facility] for annual eye exams    Goals Addressed             This Visit's Progress    Patient Stated         Depression Screen    11/17/2022    2:09 PM 08/31/2022    8:08 AM 10/26/2021    1:47 PM 08/10/2021    9:31 AM 08/11/2020    9:10 AM 04/24/2019    8:07 AM 11/17/2018   10:34 AM  PHQ 2/9 Scores  PHQ - 2 Score 0 0 0 0 0 1 0    Fall Risk    11/13/2022    1:21 PM 11/02/2022    2:02 PM 08/31/2022    8:07 AM 01/27/2022   12:07 PM 10/26/2021    1:49 PM  Fall Risk   Falls in the past year? 0 0 1 0 0  Number falls in past yr: 0 0 0  0  Injury with Fall? 0 0  0  0  Risk for fall due to : No Fall Risks    No Fall Risks  Follow up Falls prevention discussed    Falls evaluation completed     MEDICARE RISK AT HOME: Medicare Risk at Home Any stairs in or around the home?: Yes If so, are there any without handrails?: No Home free of loose throw rugs in walkways, pet beds, electrical cords, etc?: Yes Adequate lighting in your home to reduce risk of falls?: Yes Life alert?: No Use of a cane, walker or w/c?: No Grab bars in the bathroom?: Yes Shower chair or bench in shower?: Yes Elevated toilet seat or a handicapped toilet?: Yes  TIMED UP AND GO:  Was the test performed?  No    Cognitive Function:        11/17/2022    2:11 PM 10/26/2021    1:51 PM  6CIT Screen  What Year? 0 points 0 points  What month? 0 points 0 points  What time? 0 points 0 points  Count back from 20 0 points 0 points  Months in reverse 0 points 0 points  Repeat phrase 0 points 0 points  Total Score 0 points 0 points    Immunizations Immunization History  Administered Date(s) Administered   PFIZER(Purple Top)SARS-COV-2 Vaccination 04/15/2019, 05/08/2019, 12/27/2019   PNEUMOCOCCAL CONJUGATE-20 08/10/2021   Tdap 01/24/2014    TDAP status: Up to date  Flu Vaccine status: Due, Education has been provided regarding the importance of this vaccine. Advised may receive this vaccine at local pharmacy or Health Dept. Aware to provide a copy of the vaccination record if obtained from local pharmacy or Health Dept. Verbalized acceptance and understanding.  Pneumococcal vaccine status: Up to date  Covid-19 vaccine status: Information provided on how to obtain vaccines.   Qualifies for Shingles Vaccine? Yes   Zostavax completed No   Shingrix Completed?: No.    Education has been provided regarding the importance of this vaccine. Patient has been advised to call insurance company to determine out of pocket expense if they have not yet received this vaccine. Advised may also receive vaccine at local pharmacy or Health Dept. Verbalized acceptance and understanding.  Screening Tests Health  Maintenance  Topic Date Due   Colonoscopy  04/01/2022   Zoster Vaccines- Shingrix (1 of 2) 12/01/2022 (Originally 07/01/1974)   INFLUENZA VACCINE  04/25/2023 (Originally 08/26/2022)   Medicare Annual Wellness (AWV)  11/17/2023   DTaP/Tdap/Td (2 - Td or Tdap) 01/25/2024   DEXA SCAN  05/04/2024   MAMMOGRAM  09/19/2024   Pneumonia Vaccine 63+ Years old  Completed   Hepatitis C Screening  Completed   HPV VACCINES  Aged Out   COVID-19 Vaccine  Discontinued    Health Maintenance  Health Maintenance Due  Topic Date Due   Colonoscopy  04/01/2022    Colonoscopy scheduled for next week at [place] per pt   Mammogram status: Completed 09/20/22. Repeat every year  Bone Density status: Completed 05/05/22. Results reflect: Bone density results: OSTEOPENIA. Repeat every 2 years.   Additional Screening:  Hepatitis C Screening:  Completed 04/24/19  Vision Screening: Recommended annual ophthalmology exams for early detection of glaucoma and other disorders of the eye. Is the patient up to date with their annual eye exam?  Yes  Who is the provider or what is the name of the office in which the patient attends annual eye exams? Dr Cherlynn Polo  If pt is not established with a provider, would they like to be referred  to a provider to establish care? No .   Dental Screening: Recommended annual dental exams for proper oral hygiene  Community Resource Referral / Chronic Care Management: CRR required this visit?  No   CCM required this visit?  No     Plan:     I have personally reviewed and noted the following in the patient's chart:   Medical and social history Use of alcohol, tobacco or illicit drugs  Current medications and supplements including opioid prescriptions. Patient is not currently taking opioid prescriptions. Functional ability and status Nutritional status Physical activity Advanced directives List of other physicians Hospitalizations, surgeries, and ER visits in previous 12  months Vitals Screenings to include cognitive, depression, and falls Referrals and appointments  In addition, I have reviewed and discussed with patient certain preventive protocols, quality metrics, and best practice recommendations. A written personalized care plan for preventive services as well as general preventive health recommendations were provided to patient.     Marzella Schlein, LPN   [id number]   After Visit Summary: (MyChart) Due to this being a telephonic visit, the after visit summary with patients personalized plan was offered to patient via MyChart   Nurse Notes: none

## 2022-11-17 NOTE — Patient Instructions (Signed)
Jasmine Lee , Thank you for taking time to come for your Medicare Wellness Visit. I appreciate your ongoing commitment to your health goals. Please review the following plan we discussed and let me know if I can assist you in the future.   Referrals/Orders/Follow-Ups/Clinician Recommendations: Aim for 30 minutes of exercise or brisk walking, 6-8 glasses of water, and 5 servings of fruits and vegetables each day.   This is a list of the screening recommended for you and due dates:  Health Maintenance  Topic Date Due   Colon Cancer Screening  04/01/2022   Zoster (Shingles) Vaccine (1 of 2) 12/01/2022*   Flu Shot  04/25/2023*   Medicare Annual Wellness Visit  11/17/2023   DTaP/Tdap/Td vaccine (2 - Td or Tdap) 01/25/2024   DEXA scan (bone density measurement)  05/04/2024   Mammogram  09/19/2024   Pneumonia Vaccine  Completed   Hepatitis C Screening  Completed   HPV Vaccine  Aged Out   COVID-19 Vaccine  Discontinued  *Topic was postponed. The date shown is not the original due date.    Advanced directives: (Copy Requested) Please bring a copy of your health care power of attorney and living will to the office to be added to your chart at your convenience.  Next Medicare Annual Wellness Visit scheduled for next year: Yes

## 2022-11-22 ENCOUNTER — Encounter: Payer: Self-pay | Admitting: Gastroenterology

## 2022-11-24 ENCOUNTER — Encounter: Payer: Self-pay | Admitting: Gastroenterology

## 2022-11-24 ENCOUNTER — Ambulatory Visit: Payer: Medicare HMO | Admitting: Gastroenterology

## 2022-11-24 VITALS — BP 127/73 | HR 60 | Temp 97.0°F | Resp 15 | Ht 62.25 in | Wt 123.0 lb

## 2022-11-24 DIAGNOSIS — I10 Essential (primary) hypertension: Secondary | ICD-10-CM | POA: Diagnosis not present

## 2022-11-24 DIAGNOSIS — Z1211 Encounter for screening for malignant neoplasm of colon: Secondary | ICD-10-CM

## 2022-11-24 DIAGNOSIS — D120 Benign neoplasm of cecum: Secondary | ICD-10-CM | POA: Diagnosis not present

## 2022-11-24 DIAGNOSIS — D123 Benign neoplasm of transverse colon: Secondary | ICD-10-CM

## 2022-11-24 MED ORDER — SODIUM CHLORIDE 0.9 % IV SOLN: 500.0000 mL | Freq: Once | INTRAVENOUS | Status: DC

## 2022-11-24 NOTE — Progress Notes (Signed)
[facility] History and Physical   Primary Care Physician:  Natalia Leatherwood, DO   Reason for Procedure:  Colorectal cancer screening  Plan:    Screening colonoscopy with possible interventions as needed     HPI: Jasmine Lee is a very pleasant 67 y.o. female here for screening colonoscopy. Denies any nausea, vomiting, abdominal pain, melena or bright red blood per rectum  The risks and benefits as well as alternatives of endoscopic procedure(s) have been discussed and reviewed. All questions answered. The patient agrees to proceed.    Past Medical History:  Diagnosis Date   Allergy    Arthritis    Chicken pox    Endometriosis    GERD (gastroesophageal reflux disease)    Heartburn    History of fainting spells of unknown cause    History of sun-damaged skin 04/26/2014   Actinic keratosis.  Solar elastosis.  Benign nevus of right posterior neck.   HTN (hypertension)    Muscle weakness 01/2017   Patient reports being hospitalized for bilateral upper extremity weakness with negative stroke work-up.  Negative EEG.  Self resolved   Osteomyelitis (HCC) 12/2016   left ankle/foot. Patient had multiple surgeries and treated with antibiotics.   Seasonal allergies    Toxic metabolic encephalopathy 01/2017   Extensive negative work-up reported by PCP records.  Thought to be secondary to IV antibiotics for osteomyelitis.    Past Surgical History:  Procedure Laterality Date   BUNIONECTOMY  10/2016   Patient reports multiple postsurgical infections October/November and December 2018.   COLONOSCOPY  03/31/2012   Nonbleeding internal hemorrhoids otherwise normal exam.  Repeat in 10 years.   ESOPHAGOGASTRODUODENOSCOPY ENDOSCOPY  2022   HERNIA REPAIR  1962   LAPAROSCOPIC ENDOMETRIOSIS FULGURATION  1986   RHINOPLASTY     Completed for deviated septum.   WISDOM TOOTH EXTRACTION      Prior to Admission medications   Medication Sig Start Date End Date Taking?  Authorizing Provider  aspirin EC 81 MG tablet Take 81 mg by mouth daily.   Yes [provider]  cetirizine (ZYRTEC) 10 MG tablet Take 1 tablet (10 mg total) by mouth at bedtime. 08/31/22  Yes Kuneff, Renee A, DO  Cyanocobalamin (VITAMIN B 12 PO) Take by mouth.   Yes [provider]  famotidine (PEPCID) 20 MG tablet Take 1 tablet (20 mg total) by mouth 2 (two) times daily. 08/31/22  Yes Kuneff, Renee A, DO  ipratropium (ATROVENT) 0.06 % nasal spray Place 2 sprays into both nostrils 4 (four) times daily. 08/31/22  Yes Kuneff, Renee A, DO  losartan-hydrochlorothiazide (HYZAAR) 50-12.5 MG tablet Take 1 tablet by mouth daily. 08/31/22  Yes Kuneff, Renee A, DO  Multiple Vitamins-Minerals (MULTIVITAMIN ADULT PO) Take 1 tablet by mouth daily.   Yes [provider]  Omega-3 Fatty Acids (FISH OIL) 1000 MG CAPS Take 1 capsule by mouth daily at 2 PM.   Yes [provider]  pantoprazole (PROTONIX) 40 MG tablet Take 1 tablet (40 mg total) by mouth 2 (two) times daily. 08/31/22  Yes Kuneff, Renee A, DO  Probiotic Product (PROBIOTIC ADVANCED PO) Take by mouth daily.   Yes [provider]  Quercetin 500 MG CAPS Take by mouth.   Yes [provider]  Calcium 250 MG CAPS Take 1 capsule by mouth daily.    [provider]  clobetasol (TEMOVATE) 0.05 % external solution Apply topically. 02/13/20   [provider]    Current Outpatient Medications  Medication Sig  Dispense Refill   aspirin EC 81 MG tablet Take 81 mg by mouth daily.     cetirizine (ZYRTEC) 10 MG tablet Take 1 tablet (10 mg total) by mouth at bedtime. 90 tablet 3   Cyanocobalamin (VITAMIN B 12 PO) Take by mouth.     famotidine (PEPCID) 20 MG tablet Take 1 tablet (20 mg total) by mouth 2 (two) times daily. 90 tablet 3   ipratropium (ATROVENT) 0.06 % nasal spray Place 2 sprays into both nostrils 4 (four) times daily. 15 mL 5   losartan-hydrochlorothiazide (HYZAAR) 50-12.5 MG tablet Take 1 tablet  by mouth daily. 90 tablet 1   Multiple Vitamins-Minerals (MULTIVITAMIN ADULT PO) Take 1 tablet by mouth daily.     Omega-3 Fatty Acids (FISH OIL) 1000 MG CAPS Take 1 capsule by mouth daily at 2 PM.     pantoprazole (PROTONIX) 40 MG tablet Take 1 tablet (40 mg total) by mouth 2 (two) times daily. 180 tablet 3   Probiotic Product (PROBIOTIC ADVANCED PO) Take by mouth daily.     Quercetin 500 MG CAPS Take by mouth.     Calcium 250 MG CAPS Take 1 capsule by mouth daily.     clobetasol (TEMOVATE) 0.05 % external solution Apply topically.     Current Facility-Administered Medications  Medication Dose Route Frequency Provider Last Rate Last Admin   0.9 %  sodium chloride infusion  500 mL Intravenous Once Napoleon Form, MD        Allergies as of 11/24/2022 - Review Complete 11/24/2022  Allergen Reaction Noted   Oxycodone Other (See Comments) 04/24/2019   Heparin Other (See Comments) 02/20/2020    Family History  Problem Relation Age of Onset   Arthritis Mother    Hearing loss Mother    Heart attack Mother    Miscarriages / [place] Mother    Hyperlipidemia Mother    Breast cancer Mother 44   Alcohol abuse Father    Diabetes Father    Hypertension Father    Stroke Father    Diabetes Sister    Early death Sister    Heart disease Sister    Stroke Sister    Depression Sister    Colon polyps Sister    Ovarian cancer Maternal Aunt    Arthritis Maternal Grandmother    Hypertension Maternal Grandmother    Diabetes Maternal Grandfather    Early death Maternal Grandfather    Hyperlipidemia Maternal Grandfather    Hypertension Maternal Grandfather    Heart attack Maternal Grandfather    Heart attack Paternal Grandmother    Early death Paternal Grandfather    Heart attack Paternal Grandfather    Colon cancer Neg Hx    Esophageal cancer Neg Hx    Rectal cancer Neg Hx    Stomach cancer Neg Hx     Social History   Socioeconomic History   Marital status: Married    Spouse  name: Not on file   Number of children: Not on file   Years of education: Not on file   Highest education level: Associate degree: occupational, Scientist, product/process development, or vocational program  Occupational History   Not on file  Tobacco Use   Smoking status: Never   Smokeless tobacco: Never  Vaping Use   Vaping status: Never Used  Substance and Sexual Activity   Alcohol use: Yes    Alcohol/week: 14.0 standard drinks of alcohol    Types: 14 Standard drinks or equivalent per week    Comment: wine and liquor  daily    Drug use: Never   Sexual activity: Not Currently    Comment: 1st intercourse 7 yo- 5 partners  Other Topics Concern   Not on file  Social History Narrative   Marital status/children/pets: Married.  No children.   Education/employment: Associates degree, retired Counsellor.   Safety:      -smoke alarm in the home:Yes     - wears seatbelt: Yes     - Feels safe in their relationships: Yes   Social Determinants of Health   Financial Resource Strain: Patient Declined (11/13/2022)   Overall Financial Resource Strain (CARDIA)    Difficulty of Paying Living Expenses: Patient declined  Food Insecurity: Patient Declined (11/13/2022)   Hunger Vital Sign    Worried About Running Out of Food in the Last Year: Patient declined    Ran Out of Food in the Last Year: Patient declined  Transportation Needs: No Transportation Needs (11/13/2022)   PRAPARE - Administrator, Civil Service (Medical): No    Lack of Transportation (Non-Medical): No  Physical Activity: Insufficiently Active (11/13/2022)   Exercise Vital Sign    Days of Exercise per Week: 6 days    Minutes of Exercise per Session: 20 min  Stress: No Stress Concern Present (11/13/2022)   [employer] of Occupational Health - Occupational Stress Questionnaire    Feeling of Stress : Not at all  Social Connections: Unknown (11/13/2022)   Social Connection and Isolation Panel [NHANES]    Frequency of Communication  with Friends and Family: More than three times a week    Frequency of Social Gatherings with Friends and Family: Patient declined    Attends Religious Services: 1 to 4 times per year    Active Member of [employer] or Organizations: Patient declined    Attends Banker Meetings: Patient declined    Marital Status: Married  Catering manager Violence: Not At Risk (11/17/2022)   Humiliation, Afraid, Rape, and Kick questionnaire    Fear of Current or Ex-Partner: No    Emotionally Abused: No    Physically Abused: No    Sexually Abused: No    Review of Systems:  All other review of systems negative except as mentioned in the HPI.  Physical Exam: Vital signs in last 24 hours: BP 117/83   Pulse 75   Temp (!) 97 F (36.1 C)   Resp 14   Ht 5' 2.25" (1.581 m)   Wt 123 lb (55.8 kg)   SpO2 99%   BMI 22.32 kg/m  General:   Alert, NAD Lungs:  Clear .   Heart:  Regular rate and rhythm Abdomen:  Soft, nontender and nondistended. Neuro/Psych:  Alert and cooperative. Normal mood and affect. A and O x 3  Reviewed labs, radiology imaging, old records and pertinent past GI work up  Patient is appropriate for planned procedure(s) and anesthesia in an ambulatory setting   K. Scherry Ran , MD [phone]

## 2022-11-24 NOTE — Progress Notes (Signed)
Called to room to assist during endoscopic procedure.  Patient ID and intended procedure confirmed with present staff. Received instructions for my participation in the procedure from the performing physician.  

## 2022-11-24 NOTE — Progress Notes (Signed)
Vss nad trans to pacu 

## 2022-11-24 NOTE — Patient Instructions (Signed)

## 2022-11-24 NOTE — Op Note (Signed)
[facility] Patient Name: Jasmine Lee Procedure Date: 11/24/2022 2:47 PM MRN: [id number] Endoscopist: Napoleon Form , MD, [id number] Age: 67 Referring MD:  Date of Birth: Jun 20, 1955 Gender: Female Account #: [id number] Procedure:                Colonoscopy Indications:              Screening for colorectal malignant neoplasm Medicines:                Monitored Anesthesia Care Procedure:                Pre-Anesthesia Assessment:                           - Prior to the procedure, a History and Physical                            was performed, and patient medications and                            allergies were reviewed. The patient's tolerance of                            previous anesthesia was also reviewed. The risks                            and benefits of the procedure and the sedation                            options and risks were discussed with the patient.                            All questions were answered, and informed consent                            was obtained. Prior Anticoagulants: The patient has                            taken no anticoagulant or antiplatelet agents. ASA                            Grade Assessment: II - A patient with mild systemic                            disease. After reviewing the risks and benefits,                            the patient was deemed in satisfactory condition to                            undergo the procedure.                           After obtaining informed consent, the colonoscope  was passed under direct vision. Throughout the                            procedure, the patient's blood pressure, pulse, and                            oxygen saturations were monitored continuously. The                            PCF-HQ190L Colonoscope [id number] was introduced                            through the anus and advanced to the the cecum,                            identified by  appendiceal orifice and ileocecal                            valve. The colonoscopy was performed without                            difficulty. The patient tolerated the procedure                            well. The quality of the bowel preparation was                            good. The ileocecal valve, appendiceal orifice, and                            rectum were photographed. Scope In: 2:49:42 PM Scope Out: 3:08:33 PM Scope Withdrawal Time: 0 hours 7 minutes 54 seconds  Total Procedure Duration: 0 hours 18 minutes 51 seconds  Findings:                 The perianal and digital rectal examinations were                            normal.                           A 6 mm polyp was found in the transverse colon. The                            polyp was sessile. The polyp was removed with a                            cold snare. Resection and retrieval were complete.                           Non-bleeding external and internal hemorrhoids were                            found during retroflexion. The hemorrhoids were  medium-sized. Complications:            No immediate complications. Estimated Blood Loss:     Estimated blood loss was minimal. Impression:               - One 6 mm polyp in the transverse colon, removed                            with a cold snare. Resected and retrieved.                           - Non-bleeding external and internal hemorrhoids. Recommendation:           - Patient has a contact number available for                            emergencies. The signs and symptoms of potential                            delayed complications were discussed with the                            patient. Return to normal activities tomorrow.                            Written discharge instructions were provided to the                            patient.                           - Resume previous diet.                           - Continue present  medications.                           - Await pathology results.                           - Repeat colonoscopy in 5-10 years for surveillance                            based on pathology results. Napoleon Form, MD 11/24/2022 3:15:07 PM This report has been signed electronically.

## 2022-11-24 NOTE — Progress Notes (Signed)
Pt has large healing bruise to upper left arm.

## 2022-11-25 ENCOUNTER — Telehealth: Payer: Self-pay

## 2022-11-25 NOTE — Telephone Encounter (Signed)
  Follow up Call-     11/24/2022    1:34 PM  Call back number  Post procedure Call Back phone  # [phone]  Permission to leave phone message Yes     Patient questions:  Do you have a fever, pain , or abdominal swelling? No. Pain Score  0 *  Have you tolerated food without any problems? Yes.    Have you been able to return to your normal activities? Yes.    Do you have any questions about your discharge instructions: Diet   No. Medications  No. Follow up visit  No.  Do you have questions or concerns about your Care? No.  Actions: * If pain score is 4 or above: No action needed, pain <4.

## 2022-11-29 LAB — SURGICAL PATHOLOGY

## 2022-12-31 ENCOUNTER — Encounter: Payer: Self-pay | Admitting: Gastroenterology

## 2023-02-07 DIAGNOSIS — Z9889 Other specified postprocedural states: Secondary | ICD-10-CM | POA: Diagnosis not present

## 2023-02-07 DIAGNOSIS — L851 Acquired keratosis [keratoderma] palmaris et plantaris: Secondary | ICD-10-CM | POA: Diagnosis not present

## 2023-02-07 DIAGNOSIS — Z1330 Encounter for screening examination for mental health and behavioral disorders, unspecified: Secondary | ICD-10-CM | POA: Diagnosis not present

## 2023-02-07 DIAGNOSIS — M7742 Metatarsalgia, left foot: Secondary | ICD-10-CM | POA: Diagnosis not present

## 2023-02-07 DIAGNOSIS — M2042 Other hammer toe(s) (acquired), left foot: Secondary | ICD-10-CM | POA: Diagnosis not present

## 2023-02-15 ENCOUNTER — Ambulatory Visit: Payer: No Typology Code available for payment source | Admitting: Family Medicine

## 2023-02-15 ENCOUNTER — Encounter: Payer: Self-pay | Admitting: Family Medicine

## 2023-02-15 VITALS — BP 122/70 | HR 76 | Temp 98.0°F | Wt 125.2 lb

## 2023-02-15 DIAGNOSIS — D126 Benign neoplasm of colon, unspecified: Secondary | ICD-10-CM

## 2023-02-15 DIAGNOSIS — K219 Gastro-esophageal reflux disease without esophagitis: Secondary | ICD-10-CM

## 2023-02-15 DIAGNOSIS — I10 Essential (primary) hypertension: Secondary | ICD-10-CM

## 2023-02-15 DIAGNOSIS — M8589 Other specified disorders of bone density and structure, multiple sites: Secondary | ICD-10-CM

## 2023-02-15 MED ORDER — IPRATROPIUM BROMIDE 0.06 % NA SOLN: 2.0000 | Freq: Four times a day (QID) | NASAL | 5 refills | Status: DC

## 2023-02-15 MED ORDER — LOSARTAN POTASSIUM-HCTZ 50-12.5 MG PO TABS
1.0000 | ORAL_TABLET | Freq: Every day | ORAL | 1 refills | Status: DC
Start: 2023-02-15 — End: 2023-08-19

## 2023-02-15 NOTE — Patient Instructions (Addendum)
Return in about 7 months (around 09/02/2023) for cpe (20 min), Routine chronic condition follow-up.        Great to see you today.  I have refilled the medication(s) we provide.   If labs were collected or images ordered, we will inform you of  results once we have received them and reviewed. We will contact you either by echart message, or telephone call.  Please give ample time to the testing facility, and our office to run,  receive and review results. Please do not call inquiring of results, even if you can see them in your chart. We will contact you as soon as we are able. If it has been over 1 week since the test was completed, and you have not yet heard from [place], then please call us.    - echart message- for normal results that have been seen by the patient already.   - telephone call: abnormal results or if patient has not viewed results in their echart.  If a referral to a specialist was entered for you, please call us in 2 weeks if you have not heard from the specialist office to schedule.

## 2023-02-15 NOTE — Progress Notes (Signed)
Patient ID: Jasmine Lee, female  DOB: [birth year], 68 y.o.   MRN: [id number] Patient Care Team    Relationship Specialty Notifications Start End  Jasmine Leatherwood, DO PCP - General Family Medicine  11/17/18   Jasmine Buff, MD Referring Physician   04/26/19   Lee, Jasmine Pole, DPM Referring Physician Surgery  04/26/19   Lee, Jasmine Dolores, MD Consulting Physician Endocrinology  11/16/19   Jasmine Lee, DPM Referring Physician Podiatry  01/28/22   Jasmine Lee, [place]  Dermatology  01/28/22   Jasmine Danas, MD (Inactive) Consulting Physician Gastroenterology  08/31/22     Chief Complaint  Patient presents with   Hypertension    Subjective:  Jasmine Lee is a 68 y.o.  Female  present for chronic condition management All past medical history, surgical history, allergies, family history, immunizations, medications and social history were updated in the electronic medical record today. All recent labs, ED visits and hospitalizations within the last year were reviewed.  Hypertension: Pt reports compliance with losartan-HCTZ 50-12.5 mg.  Patient denies chest pain, shortness of breath, dizziness or lower extremity edema.  Pt takes a  daily baby ASA. Pt is not prescribed statin.   Osteoarthritis, unspecified osteoarthritis type, unspecified site Patient has a history of osteoarthritis.  She has a history of GERD and does not tolerate NSAIDs very well.  She reports her left elbow and bilateral carpometacarpal joints are the most painful.  encouraged topical OTC treatments in the past.   GERD without esophagitis Patient is prescribed Protonix 40 mg twice daily by Dr. Olam Lee.  She reports compliance with Pepcid 20 mg twice daily.       11/17/2022    2:09 PM 08/31/2022    8:08 AM 10/26/2021    1:47 PM 08/10/2021    9:31 AM 08/11/2020    9:10 AM  Depression screen PHQ 2/9  Decreased Interest 0 0 0 0 0  Down, Depressed, Hopeless 0 0 0 0 0  PHQ - 2 Score 0 0 0  0 0       No data to display           Immunization History  Administered Date(s) Administered   PFIZER(Purple Top)SARS-COV-2 Vaccination 04/15/2019, 05/08/2019, 12/27/2019   PNEUMOCOCCAL CONJUGATE-20 08/10/2021   Tdap 01/24/2014    Past Medical History:  Diagnosis Date   Allergy    Arthritis    Chicken pox    Endometriosis    GERD (gastroesophageal reflux disease)    Heartburn    History of fainting spells of unknown cause    History of sun-damaged skin 04/26/2014   Actinic keratosis.  Solar elastosis.  Benign nevus of right posterior neck.   HTN (hypertension)    Muscle weakness 01/2017   Patient reports being hospitalized for bilateral upper extremity weakness with negative stroke work-up.  Negative EEG.  Self resolved   Osteomyelitis (HCC) 12/2016   left ankle/foot. Patient had multiple surgeries and treated with antibiotics.   Seasonal allergies    Toxic metabolic encephalopathy 01/2017   Extensive negative work-up reported by PCP records.  Thought to be secondary to IV antibiotics for osteomyelitis.   Allergies  Allergen Reactions   Oxycodone Other (See Comments)    hallucination   Heparin Other (See Comments)    hallucination   Past Surgical History:  Procedure Laterality Date   BUNIONECTOMY  10/2016   Patient reports multiple postsurgical infections October/November and December 2018.   COLONOSCOPY  03/31/2012   Nonbleeding internal  hemorrhoids otherwise normal exam.  Repeat in 10 years.   ESOPHAGOGASTRODUODENOSCOPY ENDOSCOPY  2022   HERNIA REPAIR  1962   LAPAROSCOPIC ENDOMETRIOSIS FULGURATION  1986   RHINOPLASTY     Completed for deviated septum.   WISDOM TOOTH EXTRACTION     Family History  Problem Relation Age of Onset   Arthritis Mother    Hearing loss Mother    Heart attack Mother    Miscarriages / Stillbirths Mother    Hyperlipidemia Mother    Breast cancer Mother 74   Alcohol abuse Father    Diabetes Father    Hypertension Father     Stroke Father    Diabetes Sister    Early death Sister    Heart disease Sister    Stroke Sister    Depression Sister    Colon polyps Sister    Ovarian cancer Maternal Aunt    Arthritis Maternal Grandmother    Hypertension Maternal Grandmother    Diabetes Maternal Grandfather    Early death Maternal Grandfather    Hyperlipidemia Maternal Grandfather    Hypertension Maternal Grandfather    Heart attack Maternal Grandfather    Heart attack Paternal Grandmother    Early death Paternal Grandfather    Heart attack Paternal Grandfather    Colon cancer Neg Hx    Esophageal cancer Neg Hx    Rectal cancer Neg Hx    Stomach cancer Neg Hx    Social History   Social History Narrative   Marital status/children/pets: Married.  No children.   Education/employment: Associates degree, retired Counsellor.   Safety:      -smoke alarm in the home:Yes     - wears seatbelt: Yes     - Feels safe in their relationships: Yes    Allergies as of 02/15/2023       Reactions   Oxycodone Other (See Comments)   hallucination   Heparin Other (See Comments)   hallucination        Medication List        Accurate as of February 15, 2023  8:52 AM. If you have any questions, ask your nurse or doctor.          aspirin EC 81 MG tablet Take 81 mg by mouth daily.   Calcium 250 MG Caps Take 1 capsule by mouth daily.   cetirizine 10 MG tablet Commonly known as: ZYRTEC Take 1 tablet (10 mg total) by mouth at bedtime.   clobetasol 0.05 % external solution Commonly known as: TEMOVATE Apply topically.   COQ10 PO Take by mouth.   famotidine 20 MG tablet Commonly known as: PEPCID Take 1 tablet (20 mg total) by mouth 2 (two) times daily.   Fish Oil 1000 MG Caps Take 1 capsule by mouth daily at 2 PM.   ipratropium 0.06 % nasal spray Commonly known as: ATROVENT Place 2 sprays into both nostrils 4 (four) times daily.   losartan-hydrochlorothiazide 50-12.5 MG tablet Commonly known as:  HYZAAR Take 1 tablet by mouth daily.   MULTIVITAMIN ADULT PO Take 1 tablet by mouth daily.   pantoprazole 40 MG tablet Commonly known as: PROTONIX Take 1 tablet (40 mg total) by mouth 2 (two) times daily.   PROBIOTIC ADVANCED PO Take by mouth daily.   QC TUMERIC COMPLEX PO Take by mouth.   Quercetin 500 MG Caps Take by mouth.   VITAMIN B 12 PO Take by mouth.        All past medical history, surgical history,  allergies, family history, immunizations andmedications were updated in the EMR today and reviewed under the history and medication portions of their EMR.      ROS 14 pt review of systems performed and negative (unless mentioned in an HPI)  Objective: BP 122/70   Pulse 76   Temp 98 F (36.7 C)   Wt 125 lb 3.2 oz (56.8 kg)   SpO2 98%   BMI 22.72 kg/m  Physical Exam Vitals and nursing note reviewed.  Constitutional:      General: She is not in acute distress.    Appearance: Normal appearance. She is not ill-appearing, toxic-appearing or diaphoretic.  HENT:     Head: Normocephalic and atraumatic.  Eyes:     General: No scleral icterus.       Right eye: No discharge.        Left eye: No discharge.     Extraocular Movements: Extraocular movements intact.     Conjunctiva/sclera: Conjunctivae normal.     Pupils: Pupils are equal, round, and reactive to light.  Cardiovascular:     Rate and Rhythm: Normal rate and regular rhythm.     Heart sounds: No murmur heard. Pulmonary:     Effort: Pulmonary effort is normal. No respiratory distress.     Breath sounds: Normal breath sounds. No wheezing, rhonchi or rales.  Musculoskeletal:     Right lower leg: No edema.     Left lower leg: No edema.  Skin:    General: Skin is warm.     Findings: No rash.  Neurological:     Mental Status: She is alert and oriented to person, place, and time. Mental status is at baseline.     Motor: No weakness.     Gait: Gait normal.  Psychiatric:        Mood and Affect: Mood  normal.        Behavior: Behavior normal.        Thought Content: Thought content normal.        Judgment: Judgment normal.      No results found.  Assessment/plan: Zerina Guider is a 68 y.o. female present  for chronic condition management Hypertension: Stable Continue losartan-HCTZ Continue baby aspirin Routine exercise and healthy diet recommended. Labs due next visit  Osteoarthritis, unspecified osteoarthritis type, unspecified site avoid NSAIDs with her history of GERD and elevated liver enzymes Discussed turmeric supplement Aspercreme and capsaicin for carpometacarpal joints.   GERD without esophagitis Continue Protonix 40 mg twice daily-has been evaluated by GI, chronic prescription.  Agreed to take over prescription for patient. Continue Pepcid 20 mg twice daily -Vitamin D, B12 and magnesium levels monitored yearly  Osteopenia, unspecified location DEXA up to date repeat due 04/2024.  T-score -1.8 - elevated alk phos> followed by endo now.   Allergic rhinitis: Continue zyrtec nightly Continue atrovent prn  Return in about 7 months (around 09/02/2023) for cpe (20 min), Routine chronic condition follow-up.  No orders of the defined types were placed in this encounter.   No orders of the defined types were placed in this encounter.  Meds ordered this encounter  Medications   losartan-hydrochlorothiazide (HYZAAR) 50-12.5 MG tablet    Sig: Take 1 tablet by mouth daily.    Dispense:  90 tablet    Refill:  1   ipratropium (ATROVENT) 0.06 % nasal spray    Sig: Place 2 sprays into both nostrils 4 (four) times daily.    Dispense:  15 mL    Refill:  5  Referral Orders  No referral(s) requested today      Electronically signed by: Jasmine Pacini, DO [facility] [place]

## 2023-03-20 ENCOUNTER — Other Ambulatory Visit: Payer: Self-pay | Admitting: Family Medicine

## 2023-08-19 ENCOUNTER — Other Ambulatory Visit: Payer: Self-pay | Admitting: Family Medicine

## 2023-08-19 DIAGNOSIS — K222 Esophageal obstruction: Secondary | ICD-10-CM

## 2023-08-19 DIAGNOSIS — R1310 Dysphagia, unspecified: Secondary | ICD-10-CM

## 2023-09-05 ENCOUNTER — Ambulatory Visit: Payer: No Typology Code available for payment source | Admitting: Family Medicine

## 2023-09-05 ENCOUNTER — Encounter: Payer: Self-pay | Admitting: Family Medicine

## 2023-09-05 ENCOUNTER — Other Ambulatory Visit: Payer: Self-pay | Admitting: Family Medicine

## 2023-09-05 VITALS — BP 122/82 | HR 76 | Temp 97.9°F | Ht 62.0 in | Wt 111.6 lb

## 2023-09-05 DIAGNOSIS — I10 Essential (primary) hypertension: Secondary | ICD-10-CM | POA: Diagnosis not present

## 2023-09-05 DIAGNOSIS — Z13220 Encounter for screening for lipoid disorders: Secondary | ICD-10-CM

## 2023-09-05 DIAGNOSIS — K219 Gastro-esophageal reflux disease without esophagitis: Secondary | ICD-10-CM | POA: Diagnosis not present

## 2023-09-05 DIAGNOSIS — M8589 Other specified disorders of bone density and structure, multiple sites: Secondary | ICD-10-CM

## 2023-09-05 DIAGNOSIS — Z1231 Encounter for screening mammogram for malignant neoplasm of breast: Secondary | ICD-10-CM

## 2023-09-05 DIAGNOSIS — R1310 Dysphagia, unspecified: Secondary | ICD-10-CM

## 2023-09-05 DIAGNOSIS — K222 Esophageal obstruction: Secondary | ICD-10-CM

## 2023-09-05 DIAGNOSIS — Z5181 Encounter for therapeutic drug level monitoring: Secondary | ICD-10-CM

## 2023-09-05 DIAGNOSIS — Z79899 Other long term (current) drug therapy: Secondary | ICD-10-CM

## 2023-09-05 DIAGNOSIS — D126 Benign neoplasm of colon, unspecified: Secondary | ICD-10-CM | POA: Diagnosis not present

## 2023-09-05 DIAGNOSIS — Z0000 Encounter for general adult medical examination without abnormal findings: Secondary | ICD-10-CM

## 2023-09-05 DIAGNOSIS — Z7689 Persons encountering health services in other specified circumstances: Secondary | ICD-10-CM

## 2023-09-05 DIAGNOSIS — Z131 Encounter for screening for diabetes mellitus: Secondary | ICD-10-CM

## 2023-09-05 LAB — LIPID PANEL
Cholesterol: 226 mg/dL — ABNORMAL HIGH (ref 0–200)
HDL: 80.2 mg/dL (ref >39.00)
LDL Cholesterol: 130 mg/dL — ABNORMAL HIGH (ref 0–99)
NonHDL: 146.23
Total CHOL/HDL Ratio: 3
Triglycerides: 83 mg/dL (ref 0.0–149.0)
VLDL: 16.6 mg/dL (ref 0.0–40.0)

## 2023-09-05 LAB — COMPREHENSIVE METABOLIC PANEL WITH GFR
ALT: 15 U/L (ref 0–35)
AST: 18 U/L (ref 0–37)
Albumin: 4.3 g/dL (ref 3.5–5.2)
Alkaline Phosphatase: 101 U/L (ref 39–117)
BUN: 19 mg/dL (ref 6–23)
CO2: 33 meq/L — ABNORMAL HIGH (ref 19–32)
Calcium: 9.7 mg/dL (ref 8.4–10.5)
Chloride: 97 meq/L (ref 96–112)
Creatinine, Ser: 0.75 mg/dL (ref 0.40–1.20)
GFR: 82.52 mL/min (ref >60.00)
Glucose, Bld: 94 mg/dL (ref 70–99)
Potassium: 4.4 meq/L (ref 3.5–5.1)
Sodium: 138 meq/L (ref 135–145)
Total Bilirubin: 0.5 mg/dL (ref 0.2–1.2)
Total Protein: 7 g/dL (ref 6.0–8.3)

## 2023-09-05 LAB — HEMOGLOBIN A1C: Hgb A1c MFr Bld: 6.4 % (ref 4.6–6.5)

## 2023-09-05 LAB — CBC
HCT: 40.9 % (ref 36.0–46.0)
Hemoglobin: 13.5 g/dL (ref 12.0–15.0)
MCHC: 33.1 g/dL (ref 30.0–36.0)
MCV: 91.1 fl (ref 78.0–100.0)
Platelets: 386 K/uL (ref 150.0–400.0)
RBC: 4.49 Mil/uL (ref 3.87–5.11)
RDW: 12.6 % (ref 11.5–15.5)
WBC: 5.9 K/uL (ref 4.0–10.5)

## 2023-09-05 LAB — TSH: TSH: 1.3 u[IU]/mL (ref 0.35–5.50)

## 2023-09-05 LAB — MAGNESIUM: Magnesium: 1.9 mg/dL (ref 1.5–2.5)

## 2023-09-05 LAB — B12 AND FOLATE PANEL
Folate: >23.4 ng/mL (ref >5.9)
Vitamin B-12: 907 pg/mL (ref 211–911)

## 2023-09-05 LAB — VITAMIN D 25 HYDROXY (VIT D DEFICIENCY, FRACTURES): VITD: 43.71 ng/mL (ref 30.00–100.00)

## 2023-09-05 MED ORDER — TETANUS-DIPHTH-ACELL PERTUSSIS 5-2.5-18.5 LF-MCG/0.5 IM SUSP: 0.5000 mL | Freq: Once | INTRAMUSCULAR | 0 refills | Status: AC

## 2023-09-05 MED ORDER — PANTOPRAZOLE SODIUM 40 MG PO TBEC
40.0000 mg | DELAYED_RELEASE_TABLET | Freq: Two times a day (BID) | ORAL | 1 refills | Status: AC
Start: 2023-09-05 — End: ?

## 2023-09-05 MED ORDER — IPRATROPIUM BROMIDE 0.06 % NA SOLN: 2.0000 | Freq: Four times a day (QID) | NASAL | 11 refills | Status: AC

## 2023-09-05 MED ORDER — LOSARTAN POTASSIUM-HCTZ 50-12.5 MG PO TABS: 1.0000 | ORAL_TABLET | Freq: Every day | ORAL | 1 refills | Status: AC

## 2023-09-05 MED ORDER — FAMOTIDINE 20 MG PO TABS: 20.0000 mg | ORAL_TABLET | Freq: Two times a day (BID) | ORAL | 1 refills | Status: DC

## 2023-09-05 NOTE — Progress Notes (Signed)
 Patient ID: Jasmine Lee, female  DOB: September 24, 1955, 68 y.o.   MRN: [id number] Patient Care Team    Relationship Specialty Notifications Start End  Jasmine Lee LABOR, DO PCP - General Family Medicine  11/17/18   Jasmine Elspeth SAUNDERS, MD Referring Physician   04/26/19   Jasmine Lee, DPM Referring Physician Surgery  04/26/19   Shamleffer, Donell Cardinal, MD Consulting Physician Endocrinology  11/16/19   Jasmine Lee, DPM Referring Physician Podiatry  01/28/22   Jasmine Lee, [place]  Dermatology  01/28/22   Jasmine Gustav GAILS, MD Consulting Physician Gastroenterology  09/05/23     Chief Complaint  Patient presents with   Annual Exam    And chronic condition management Pt is fasting.     Subjective:  Jasmine Lee is a 68 y.o.  Female  present for CPE and combined chronic condition management All past medical history, surgical history, allergies, family history, immunizations, medications and social history were updated in the electronic medical record today. All recent labs, ED visits and hospitalizations within the last year were reviewed.  Health maintenance:  Colonoscopy: Colonoscopy completed 11/24/2022.  Dr. Shila 7-year follow-up  Mammogram: completed: 09/20/2022> ordered [facility]-ordered Immunizations: tdap due-printed, Influenza (encouraged yearly), Shingrix declined. PNA20 completed  infectious disease screening: HIV and Hep C completed DEXA: Estrogen deficient.  osteopenia 05/05/2022.  [facility] (-1.8) repeat 2-3 years Patient has a Dental home. Hospitalizations/ED visits: Reviewed  Hypertension: Pt reports compliance with losartan -HCTZ 50-12.5 mg.  Patient denies chest pain, shortness of breath, dizziness or lower extremity edema.   Pt takes a  daily baby ASA. Pt is not prescribed statin.   Osteoarthritis, unspecified osteoarthritis type, unspecified site Patient has a history of osteoarthritis.  She has a history of GERD and does not tolerate NSAIDs very  well.  She reports her left elbow and bilateral carpometacarpal joints are the most painful.  encouraged topical OTC treatments in the past.   GERD without esophagitis Patient is prescribed Protonix  40 mg twice daily and Pepcid  20 mg twice daily.  She feels her regimen is working well for her.  She is established with gastroenterology.     09/05/2023    8:51 AM 11/17/2022    2:09 PM 08/31/2022    8:08 AM 10/26/2021    1:47 PM 08/10/2021    9:31 AM  Depression screen PHQ 2/9  Decreased Interest 0 0 0 0 0  Down, Depressed, Hopeless 0 0 0 0 0  PHQ - 2 Score 0 0 0 0 0       No data to display           Immunization History  Administered Date(s) Administered   PFIZER(Purple Top)SARS-COV-2 Vaccination 04/15/2019, 05/08/2019, 12/27/2019   PNEUMOCOCCAL CONJUGATE-20 08/10/2021   Tdap 01/24/2014    Past Medical History:  Diagnosis Date   Allergy    Arthritis    Chicken pox    Endometriosis    GERD (gastroesophageal reflux disease)    Heartburn    History of fainting spells of unknown cause    History of sun-damaged skin 04/26/2014   Actinic keratosis.  Solar elastosis.  Benign nevus of right posterior neck.   HTN (hypertension)    Muscle weakness 01/2017   Patient reports being hospitalized for bilateral upper extremity weakness with negative stroke work-up.  Negative EEG.  Self resolved   Osteomyelitis (HCC) 12/2016   left ankle/foot. Patient had multiple surgeries and treated with antibiotics.   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  Thought to be secondary to IV antibiotics for osteomyelitis.   Allergies  Allergen Reactions   Oxycodone Other (See Comments)    hallucination   Heparin Other (See Comments)    hallucination   Past Surgical History:  Procedure Laterality Date   BUNIONECTOMY  10/2016   Patient reports multiple postsurgical infections October/November and December 2018.    COLONOSCOPY  03/31/2012   Nonbleeding internal hemorrhoids otherwise normal exam.  Repeat in 10 years.   ESOPHAGOGASTRODUODENOSCOPY ENDOSCOPY  2022   HERNIA REPAIR  1962   LAPAROSCOPIC ENDOMETRIOSIS FULGURATION  1986   RHINOPLASTY     Completed for deviated septum.   WISDOM TOOTH EXTRACTION     Family History  Problem Relation Age of Onset   Arthritis Mother    Hearing loss Mother    Heart attack Mother    Miscarriages / Stillbirths Mother    Hyperlipidemia Mother    Breast cancer Mother 80   Alcohol abuse Father    Diabetes Father    Hypertension Father    Stroke Father    Diabetes Sister    Early death Sister    Heart disease Sister    Stroke Sister    Depression Sister    Colon polyps Sister    Ovarian cancer Maternal Aunt    Arthritis Maternal Grandmother    Hypertension Maternal Grandmother    Diabetes Maternal Grandfather    Early death Maternal Grandfather    Hyperlipidemia Maternal Grandfather    Hypertension Maternal Grandfather    Heart attack Maternal Grandfather    Heart attack Paternal Grandmother    Early death Paternal Grandfather    Heart attack Paternal Grandfather    Colon cancer Neg Hx    Esophageal cancer Neg Hx    Rectal cancer Neg Hx    Stomach cancer Neg Hx    Social History   Social History Narrative   Marital status/children/pets: Married.  No children.   Education/employment: Associates degree, retired Counsellor.   Safety:      -smoke alarm in the home:Yes     - wears seatbelt: Yes     - Feels safe in their relationships: Yes    Allergies as of 09/05/2023       Reactions   Oxycodone Other (See Comments)   hallucination   Heparin Other (See Comments)   hallucination        Medication List        Accurate as of September 05, 2023  8:56 AM. If you have any questions, ask your nurse or doctor.          STOP taking these medications    cetirizine  10 MG tablet Commonly known as: ZYRTEC  Stopped by: Jasmine Lee        TAKE these medications    aspirin EC 81 MG tablet Take 81 mg by mouth daily.   Calcium 250 MG Caps Take 1 capsule by mouth daily.   clobetasol 0.05 % external solution Commonly known as: TEMOVATE Apply topically.   COQ10 PO Take by mouth.   famotidine  20 MG tablet Commonly known as: PEPCID  Take 1 tablet (20 mg total) by mouth 2 (two) times daily.   Fish Oil 1000 MG Caps Take 1 capsule by mouth daily at 2 PM.   ipratropium 0.06 % nasal spray Commonly known as: ATROVENT  Place 2 sprays into both nostrils 4 (four) times daily.   losartan -hydrochlorothiazide 50-12.5 MG tablet  Commonly known as: HYZAAR Take 1 tablet by mouth daily.   MULTIVITAMIN ADULT PO Take 1 tablet by mouth daily.   pantoprazole  40 MG tablet Commonly known as: PROTONIX  Take 1 tablet (40 mg total) by mouth 2 (two) times daily.   PROBIOTIC ADVANCED PO Take by mouth daily.   QC TUMERIC COMPLEX PO Take by mouth.   Quercetin 500 MG Caps Take by mouth.   Tdap 5-2.5-18.5 LF-MCG/0.5 injection Commonly known as: BOOSTRIX Inject 0.5 mLs into the muscle once for 1 dose. Started by: Arvis Miguez   VITAMIN B 12 PO Take by mouth.        All past medical history, surgical history, allergies, family history, immunizations andmedications were updated in the EMR today and reviewed under the history and medication portions of their EMR.      ROS 14 pt review of systems performed and negative (unless mentioned in an HPI)  Objective: BP 122/82   Pulse 76   Temp 97.9 F (36.6 C)   Ht 5' 2 (1.575 m)   Wt 111 lb 9.6 oz (50.6 kg)   SpO2 97%   BMI 20.41 kg/m  Physical Exam Vitals and nursing note reviewed.  Constitutional:      General: She is not in acute distress.    Appearance: Normal appearance. She is not ill-appearing, toxic-appearing or diaphoretic.  HENT:     Head: Normocephalic and atraumatic.     Right Ear: Tympanic membrane, ear canal and external ear normal. There is no impacted  cerumen.     Left Ear: Tympanic membrane, ear canal and external ear normal. There is no impacted cerumen.     Nose: No congestion or rhinorrhea.     Mouth/Throat:     Mouth: Mucous membranes are moist.     Pharynx: Oropharynx is clear. No oropharyngeal exudate or posterior oropharyngeal erythema.  Eyes:     General: No scleral icterus.       Right eye: No discharge.        Left eye: No discharge.     Extraocular Movements: Extraocular movements intact.     Conjunctiva/sclera: Conjunctivae normal.     Pupils: Pupils are equal, round, and reactive to light.  Cardiovascular:     Rate and Rhythm: Normal rate and regular rhythm.     Pulses: Normal pulses.     Heart sounds: Normal heart sounds. No murmur heard.    No friction rub. No gallop.  Pulmonary:     Effort: Pulmonary effort is normal. No respiratory distress.     Breath sounds: Normal breath sounds. No stridor. No wheezing, rhonchi or rales.  Chest:     Chest wall: No tenderness.  Abdominal:     General: Abdomen is flat. Bowel sounds are normal. There is no distension.     Palpations: Abdomen is soft. There is no mass.     Tenderness: There is no abdominal tenderness. There is no right CVA tenderness, left CVA tenderness, guarding or rebound.     Hernia: No hernia is present.  Musculoskeletal:        General: No swelling, tenderness or deformity. Normal range of motion.     Cervical back: Normal range of motion and neck supple. No rigidity or tenderness.     Right lower leg: No edema.     Left lower leg: No edema.  Lymphadenopathy:     Cervical: No cervical adenopathy.  Skin:    General: Skin is warm and dry.  Coloration: Skin is not jaundiced or pale.     Findings: No bruising, erythema, lesion or rash.  Neurological:     General: No focal deficit present.     Mental Status: She is alert and oriented to person, place, and time. Mental status is at baseline.     Cranial Nerves: No cranial nerve deficit.     Sensory:  No sensory deficit.     Motor: No weakness.     Coordination: Coordination normal.     Gait: Gait normal.     Deep Tendon Reflexes: Reflexes normal.  Psychiatric:        Mood and Affect: Mood normal.        Behavior: Behavior normal.        Thought Content: Thought content normal.        Judgment: Judgment normal.      No results found.  Assessment/plan: Jasmine Lee is a 68 y.o. female present  for CPE and combined chronic condition management Hypertension: Stable Continue losartan -HCTZ Continue baby aspirin Routine exercise and healthy diet recommended.   Osteoarthritis, unspecified osteoarthritis type, unspecified site avoid NSAIDs with her history of GERD and elevated liver enzymes Discussed turmeric supplement Aspercreme and capsaicin for carpometacarpal joints.   GERD without esophagitis Stable Continue Protonix  40 mg twice daily-has been evaluated by GI, chronic prescription.  Agreed to take over prescription for patient. Continue Pepcid  20 mg twice daily -Vitamin D , B12 and magnesium levels monitored yearly  Osteopenia, unspecified location DEXA up to date repeat due 04/2024.  T-score -1.8 - elevated alk phos> followed by endo now.    Breast cancer screening by mammogram - MM 3D SCREENING MAMMOGRAM BILATERAL BREAST; Future Lipid screening - Lipid panel Diabetes mellitus screening - Hemoglobin A1c Encounter for monitoring long-term proton pump inhibitor therapy - Hemoglobin A1c - Lipid panel - Vitamin D  (25 hydroxy) - B12 and Folate Panel - Magnesium  Routine general medical examination at a health care facility (Primary) - CBC - Comprehensive metabolic panel with GFR - TSH Colonoscopy: Colonoscopy completed 11/24/2022.  Dr. Shila 7-year follow-up  Mammogram: completed: 09/20/2022> ordered [facility]-ordered Immunizations: tdap due-printed, Influenza (encouraged yearly), Shingrix declined. PNA20 completed  infectious disease screening: HIV and Hep C  completed DEXA: Estrogen deficient.  osteopenia 05/05/2022.  [facility] (-1.8) repeat 2-3 years Patient was encouraged to exercise greater than 150 minutes a week. Patient was encouraged to choose a diet filled with fresh fruits and vegetables, and lean meats. AVS provided to patient today for education/recommendation on gender specific health and safety maintenance.   Return in about 25 weeks (around 02/27/2024) for Routine chronic condition follow-up.  Orders Placed This Encounter  Procedures   MM 3D SCREENING MAMMOGRAM BILATERAL BREAST   CBC   Comprehensive metabolic panel with GFR   Hemoglobin A1c   Lipid panel   TSH   Vitamin D  (25 hydroxy)   B12 and Folate Panel   Magnesium   Ambulatory referral to Gynecology    Orders Placed This Encounter  Procedures   MM 3D SCREENING MAMMOGRAM BILATERAL BREAST   CBC   Comprehensive metabolic panel with GFR   Hemoglobin A1c   Lipid panel   TSH   Vitamin D  (25 hydroxy)   B12 and Folate Panel   Magnesium   Ambulatory referral to Gynecology   Meds ordered this encounter  Medications   Tdap (BOOSTRIX) 5-2.5-18.5 LF-MCG/0.5 injection    Sig: Inject 0.5 mLs into the muscle once for 1 dose.    Dispense:  0.5 mL    Refill:  0   famotidine  (PEPCID ) 20 MG tablet    Sig: Take 1 tablet (20 mg total) by mouth 2 (two) times daily.    Dispense:  180 tablet    Refill:  1   ipratropium (ATROVENT ) 0.06 % nasal spray    Sig: Place 2 sprays into both nostrils 4 (four) times daily.    Dispense:  15 mL    Refill:  11   losartan -hydrochlorothiazide (HYZAAR) 50-12.5 MG tablet    Sig: Take 1 tablet by mouth daily.    Dispense:  90 tablet    Refill:  1   pantoprazole  (PROTONIX ) 40 MG tablet    Sig: Take 1 tablet (40 mg total) by mouth 2 (two) times daily.    Dispense:  180 tablet    Refill:  1   Referral Orders         Ambulatory referral to Gynecology        Electronically signed by: Lee Bellini, DO [facility] [place]

## 2023-09-05 NOTE — Patient Instructions (Addendum)
 Return in about 25 weeks (around 02/27/2024) for Routine chronic condition follow-up.        Great to see you today.  I have refilled the medication(s) we provide.   If labs were collected or images ordered, we will inform you of  results once we have received them and reviewed. We will contact you either by echart message, or telephone call.  Please give ample time to the testing facility, and our office to run,  receive and review results. Please do not call inquiring of results, even if you can see them in your chart. We will contact you as soon as we are able. If it has been over 1 week since the test was completed, and you have not yet heard from us , then please call us .    - echart message- for normal results that have been seen by the patient already.   - telephone call: abnormal results or if patient has not viewed results in their echart.  If a referral to a specialist was entered for you, please call us  in 2 weeks if you have not heard from the specialist office to schedule.

## 2023-09-06 ENCOUNTER — Ambulatory Visit: Payer: Self-pay | Admitting: Family Medicine

## 2023-09-27 DIAGNOSIS — D044 Carcinoma in situ of skin of scalp and neck: Secondary | ICD-10-CM | POA: Diagnosis not present

## 2023-09-27 DIAGNOSIS — D492 Neoplasm of unspecified behavior of bone, soft tissue, and skin: Secondary | ICD-10-CM | POA: Diagnosis not present

## 2023-09-29 ENCOUNTER — Ambulatory Visit
Admission: RE | Admit: 2023-09-29 | Discharge: 2023-09-29 | Disposition: A | Discharge status: Home / Self Care | Source: Ambulatory Visit | Attending: Family Medicine | Admitting: Family Medicine

## 2023-09-29 DIAGNOSIS — Z1231 Encounter for screening mammogram for malignant neoplasm of breast: Secondary | ICD-10-CM | POA: Diagnosis not present

## 2023-10-17 DIAGNOSIS — Z872 Personal history of diseases of the skin and subcutaneous tissue: Secondary | ICD-10-CM | POA: Diagnosis not present

## 2023-10-17 DIAGNOSIS — L821 Other seborrheic keratosis: Secondary | ICD-10-CM | POA: Diagnosis not present

## 2023-10-17 DIAGNOSIS — D229 Melanocytic nevi, unspecified: Secondary | ICD-10-CM | POA: Diagnosis not present

## 2023-10-17 DIAGNOSIS — L814 Other melanin hyperpigmentation: Secondary | ICD-10-CM | POA: Diagnosis not present

## 2023-10-17 DIAGNOSIS — L570 Actinic keratosis: Secondary | ICD-10-CM | POA: Diagnosis not present

## 2023-10-18 ENCOUNTER — Encounter: Payer: Self-pay | Admitting: Family Medicine

## 2023-10-18 ENCOUNTER — Other Ambulatory Visit: Payer: Self-pay | Admitting: Family Medicine

## 2023-10-18 ENCOUNTER — Other Ambulatory Visit: Payer: Self-pay

## 2023-10-18 MED ORDER — CETIRIZINE HCL 10 MG PO TABS: 10.0000 mg | ORAL_TABLET | Freq: Every day | ORAL | 1 refills | Status: AC

## 2023-10-25 DIAGNOSIS — C4442 Squamous cell carcinoma of skin of scalp and neck: Secondary | ICD-10-CM | POA: Diagnosis not present

## 2023-10-25 DIAGNOSIS — D044 Carcinoma in situ of skin of scalp and neck: Secondary | ICD-10-CM | POA: Diagnosis not present

## 2023-11-08 DIAGNOSIS — Z4802 Encounter for removal of sutures: Secondary | ICD-10-CM | POA: Diagnosis not present

## 2023-11-23 ENCOUNTER — Ambulatory Visit: Payer: Medicare HMO | Admitting: *Deleted

## 2023-11-23 VITALS — Ht 62.0 in | Wt 111.0 lb

## 2023-11-23 DIAGNOSIS — Z0000 Encounter for general adult medical examination without abnormal findings: Secondary | ICD-10-CM

## 2023-11-23 NOTE — Progress Notes (Signed)
 Subjective:   Jasmine Lee is a 68 y.o. female who presents for Medicare Annual (Subsequent) preventive examination.  Visit Complete: Virtual I connected with  [name] on 11/23/23 by a audio enabled telemedicine application and verified that I am speaking with the correct person using two identifiers.  Patient Location: Home  Provider Location: Home Office  I discussed the limitations of evaluation and management by telemedicine. The patient expressed understanding and agreed to proceed.  Vital Signs: Because this visit was a virtual/telehealth visit, some criteria may be missing or patient reported. Any vitals not documented were not able to be obtained and vitals that have been documented are patient reported.    Cardiac Risk Factors include: advanced age (>37men, >6 women);hypertension;family history of premature cardiovascular disease     Objective:    Today's Vitals   11/23/23 1435  Weight: 111 lb (50.3 kg)  Height: 5' 2 (1.575 m)   Body mass index is 20.3 kg/m.     11/23/2023    2:32 PM 10/26/2021    1:49 PM  Advanced Directives  Does Patient Have a Medical Advance Directive? No No  Would patient like information on creating a medical advance directive? No - Patient declined No - Patient declined    Current Medications (verified) Outpatient Encounter Medications as of 11/23/2023  Medication Sig   aspirin EC 81 MG tablet Take 81 mg by mouth daily.   Calcium 250 MG CAPS Take 1 capsule by mouth daily.   cetirizine  (ZYRTEC ) 10 MG tablet Take 1 tablet (10 mg total) by mouth at bedtime.   clobetasol (TEMOVATE) 0.05 % external solution Apply topically.   Coenzyme Q10 (COQ10 PO) Take by mouth.   Cyanocobalamin  (VITAMIN B 12 PO) Take by mouth.   famotidine  (PEPCID ) 20 MG tablet Take 1 tablet (20 mg total) by mouth 2 (two) times daily.   ipratropium (ATROVENT ) 0.06 % nasal spray Place 2 sprays into both nostrils 4 (four) times daily.    losartan -hydrochlorothiazide (HYZAAR) 50-12.5 MG tablet Take 1 tablet by mouth daily.   Multiple Vitamins-Minerals (MULTIVITAMIN ADULT PO) Take 1 tablet by mouth daily.   Omega-3 Fatty Acids (FISH OIL) 1000 MG CAPS Take 1 capsule by mouth daily at 2 PM.   pantoprazole  (PROTONIX ) 40 MG tablet Take 1 tablet (40 mg total) by mouth 2 (two) times daily.   Probiotic Product (PROBIOTIC ADVANCED PO) Take by mouth daily.   Quercetin 500 MG CAPS Take by mouth.   Turmeric (QC TUMERIC COMPLEX PO) Take by mouth.   No facility-administered encounter medications on file as of 11/23/2023.    Allergies (verified) Oxycodone and Heparin   History: Past Medical History:  Diagnosis Date   Allergy    Arthritis    Chicken pox    Endometriosis    GERD (gastroesophageal reflux disease)    Heartburn    History of fainting spells of unknown cause    History of sun-damaged skin 04/26/2014   Actinic keratosis.  Solar elastosis.  Benign nevus of right posterior neck.   HTN (hypertension)    Muscle weakness 01/2017   Patient reports being hospitalized for bilateral upper extremity weakness with negative stroke work-up.  Negative EEG.  Self resolved   Osteomyelitis (HCC) 12/2016   left ankle/foot. Patient had multiple surgeries and treated with antibiotics.   Seasonal allergies    Toxic metabolic encephalopathy 01/2017   Extensive negative work-up reported by PCP records.  Thought to be secondary to IV antibiotics for osteomyelitis.   Past  Surgical History:  Procedure Laterality Date   BUNIONECTOMY  10/2016   Patient reports multiple postsurgical infections October/November and December 2018.   COLONOSCOPY  03/31/2012   Nonbleeding internal hemorrhoids otherwise normal exam.  Repeat in 10 years.   ESOPHAGOGASTRODUODENOSCOPY ENDOSCOPY  2022   HERNIA REPAIR  1962   LAPAROSCOPIC ENDOMETRIOSIS FULGURATION  1986   RHINOPLASTY     Completed for deviated septum.   WISDOM TOOTH EXTRACTION     Family History   Problem Relation Age of Onset   Arthritis Mother    Hearing loss Mother    Heart attack Mother    Miscarriages / Stillbirths Mother    Hyperlipidemia Mother    Breast cancer Mother 23   Alcohol abuse Father    Diabetes Father    Hypertension Father    Stroke Father    Diabetes Sister    Early death Sister    Heart disease Sister    Stroke Sister    Depression Sister    Colon polyps Sister    Ovarian cancer Maternal Aunt    Arthritis Maternal Grandmother    Hypertension Maternal Grandmother    Diabetes Maternal Grandfather    Early death Maternal Grandfather    Hyperlipidemia Maternal Grandfather    Hypertension Maternal Grandfather    Heart attack Maternal Grandfather    Heart attack Paternal Grandmother    Early death Paternal Grandfather    Heart attack Paternal Grandfather    Colon cancer Neg Hx    Esophageal cancer Neg Hx    Rectal cancer Neg Hx    Stomach cancer Neg Hx    Social History   Socioeconomic History   Marital status: Married    Spouse name: Not on file   Number of children: Not on file   Years of education: Not on file   Highest education level: Associate degree: occupational, scientist, product/process development, or vocational program  Occupational History   Not on file  Tobacco Use   Smoking status: Never   Smokeless tobacco: Never  Vaping Use   Vaping status: Never Used  Substance and Sexual Activity   Alcohol use: Yes    Alcohol/week: 14.0 standard drinks of alcohol    Types: 14 Standard drinks or equivalent per week    Comment: wine and liquor daily    Drug use: Never   Sexual activity: Not Currently    Comment: 1st intercourse 89 yo- 5 partners  Other Topics Concern   Not on file  Social History Narrative   Marital status/children/pets: Married.  No children.   Education/employment: Associates degree, retired counsellor.   Safety:      -smoke alarm in the home:Yes     - wears seatbelt: Yes     - Feels safe in their relationships: Yes   Social Drivers  of Corporate Investment Banker Strain: Low Risk  (11/21/2023)   Overall Financial Resource Strain (CARDIA)    Difficulty of Paying Living Expenses: Not very hard  Food Insecurity: No Food Insecurity (11/21/2023)   Hunger Vital Sign    Worried About Running Out of Food in the Last Year: Never true    Ran Out of Food in the Last Year: Never true  Transportation Needs: No Transportation Needs (11/21/2023)   PRAPARE - Administrator, Civil Service (Medical): No    Lack of Transportation (Non-Medical): No  Physical Activity: Insufficiently Active (11/21/2023)   Exercise Vital Sign    Days of Exercise per Week: 7 days  Minutes of Exercise per Session: 20 min  Stress: Patient Declined (11/21/2023)   [employer] of Occupational Health - Occupational Stress Questionnaire    Feeling of Stress: Patient declined  Social Connections: Moderately Integrated (11/21/2023)   Social Connection and Isolation Panel    Frequency of Communication with Friends and Family: More than three times a week    Frequency of Social Gatherings with Friends and Family: Patient declined    Attends Religious Services: More than 4 times per year    Active Member of [employer] or Organizations: No    Attends Engineer, Structural: Not on file    Marital Status: Married    Tobacco Counseling Counseling given: Not Answered   Clinical Intake:  Pre-visit preparation completed: Yes  Pain : No/denies pain     Diabetes: No  How often do you need to have someone help you when you read instructions, pamphlets, or other written materials from your doctor or pharmacy?: 1 - Never  Interpreter Needed?: No  Information entered by :: Mliss Graff LPN   Activities of Daily Living    11/23/2023    2:34 PM 11/21/2023    3:26 PM  In your present state of health, do you have any difficulty performing the following activities:  Hearing? 0 0  Vision? 0 0  Difficulty concentrating or making  decisions? 0 0  Walking or climbing stairs? 0 0  Dressing or bathing? 0 0  Doing errands, shopping? 0 0  Preparing Food and eating ? N N  Using the Toilet? N N  In the past six months, have you accidently leaked urine? Y Y  Do you have problems with loss of bowel control? N N  Managing your Medications? N N  Managing your Finances? N N  Housekeeping or managing your Housekeeping? N N    Patient Care Team: Catherine Charlies LABOR, DO as PCP - General (Family Medicine) Jerrell Elspeth SAUNDERS, MD as Referring Physician Powers, Mabel RAMAN, DPM as Referring Physician (Surgery) Shamleffer, Donell Cardinal, MD as Consulting Physician (Endocrinology) Darcey, Lamar HERO, DPM as Referring Physician (Podiatry) Cain Dorothyann Millard, PA-C (Dermatology) Nandigam, Kavitha V, MD as Consulting Physician (Gastroenterology)  Indicate any recent Medical Services you may have received from other than [facility] providers in the past year (date may be approximate).     Assessment:   This is a routine wellness examination for Irwin.  Hearing/Vision screen Hearing Screening - Comments:: No trouble hearing Vision Screening - Comments:: Not up to date   Goals Addressed             This Visit's Progress    DIET - EAT MORE FRUITS AND VEGETABLES         Depression Screen    11/23/2023    2:36 PM 09/05/2023    8:51 AM 11/17/2022    2:09 PM 08/31/2022    8:08 AM 10/26/2021    1:47 PM 08/10/2021    9:31 AM 08/11/2020    9:10 AM  PHQ 2/9 Scores  PHQ - 2 Score 0 0 0 0 0 0 0  PHQ- 9 Score 0          Fall Risk    11/23/2023    2:34 PM 11/21/2023    3:26 PM 09/05/2023    8:50 AM 11/13/2022    1:21 PM 11/02/2022    2:02 PM  Fall Risk   Falls in the past year? 0 0 0 0 0  Number falls in past yr: 0  0 0 0 0  Injury with Fall? 0 0 0 0 0  Risk for fall due to :    No Fall Risks   Follow up Falls evaluation completed;Education provided;Falls prevention discussed  Falls evaluation completed Falls prevention  discussed     MEDICARE RISK AT HOME: Medicare Risk at Home Any stairs in or around the home?: Yes If so, are there any without handrails?: No Home free of loose throw rugs in walkways, pet beds, electrical cords, etc?: Yes Adequate lighting in your home to reduce risk of falls?: Yes Life alert?: No Use of a cane, walker or w/c?: No Grab bars in the bathroom?: Yes Shower chair or bench in shower?: No Elevated toilet seat or a handicapped toilet?: No  TIMED UP AND GO:  Was the test performed?  No    Cognitive Function:        11/23/2023    2:35 PM 11/17/2022    2:11 PM 10/26/2021    1:51 PM  6CIT Screen  What Year? 0 points 0 points 0 points  What month? 0 points 0 points 0 points  What time? 0 points 0 points 0 points  Count back from 20 0 points 0 points 0 points  Months in reverse 0 points 0 points 0 points  Repeat phrase 0 points 0 points 0 points  Total Score 0 points 0 points 0 points    Immunizations Immunization History  Administered Date(s) Administered   PFIZER(Purple Top)SARS-COV-2 Vaccination 04/15/2019, 05/08/2019, 12/27/2019   PNEUMOCOCCAL CONJUGATE-20 08/10/2021   Tdap 01/24/2014    TDAP status: Up to date  Flu Vaccine status: Declined, Education has been provided regarding the importance of this vaccine but patient still declined. Advised may receive this vaccine at local pharmacy or Health Dept. Aware to provide a copy of the vaccination record if obtained from local pharmacy or Health Dept. Verbalized acceptance and understanding.  Pneumococcal vaccine status: Up to date  Covid-19 vaccine status: Declined, Education has been provided regarding the importance of this vaccine but patient still declined. Advised may receive this vaccine at local pharmacy or Health Dept.or vaccine clinic. Aware to provide a copy of the vaccination record if obtained from local pharmacy or Health Dept. Verbalized acceptance and understanding.  Qualifies for Shingles  Vaccine? Yes   Zostavax completed No   Shingrix Completed?: No.    Education has been provided regarding the importance of this vaccine. Patient has been advised to call insurance company to determine out of pocket expense if they have not yet received this vaccine. Advised may also receive vaccine at local pharmacy or Health Dept. Verbalized acceptance and understanding.  Screening Tests Health Maintenance  Topic Date Due   Influenza Vaccine  04/24/2024 (Originally 08/26/2023)   DTaP/Tdap/Td (2 - Td or Tdap) 01/25/2024   DEXA SCAN  05/04/2024   Mammogram  09/28/2024   Medicare Annual Wellness (AWV)  11/22/2024   Colonoscopy  11/23/2029   Pneumococcal Vaccine: 50+ Years  Completed   Hepatitis C Screening  Completed   Meningococcal B Vaccine  Aged Out   COVID-19 Vaccine  Discontinued   Zoster Vaccines- Shingrix  Discontinued    Health Maintenance  There are no preventive care reminders to display for this patient.   Colorectal cancer screening: Type of screening: Colonoscopy. Completed 2024. Repeat every 7 years  Mammogram status: Completed  . Repeat every year  Bone Density status: Completed 2023. Results reflect: Bone density results: OSTEOPENIA. Repeat every 2 years.  Lung Cancer Screening: (  Low Dose CT Chest recommended if Age 70-80 years, 20 pack-year currently smoking OR have quit w/in 15years.) does not qualify.   Lung Cancer Screening Referral:   Additional Screening:  Hepatitis C Screening: does not qualify; Completed 2022  Vision Screening: Recommended annual ophthalmology exams for early detection of glaucoma and other disorders of the eye. Is the patient up to date with their annual eye exam?  No  Who is the provider or what is the name of the office in which the patient attends annual eye exams? Education provided If pt is not established with a provider, would they like to be referred to a provider to establish care? No .   Dental Screening: Recommended annual  dental exams for proper oral hygiene    Community Resource Referral / Chronic Care Management: CRR required this visit?  No   CCM required this visit?  No     Plan:     I have personally reviewed and noted the following in the patient's chart:   Medical and social history Use of alcohol, tobacco or illicit drugs  Current medications and supplements including opioid prescriptions. Patient is not currently taking opioid prescriptions. Functional ability and status Nutritional status Physical activity Advanced directives List of other physicians Hospitalizations, surgeries, and ER visits in previous 12 months Vitals Screenings to include cognitive, depression, and falls Referrals and appointments  In addition, I have reviewed and discussed with patient certain preventive protocols, quality metrics, and best practice recommendations. A written personalized care plan for preventive services as well as general preventive health recommendations were provided to patient.     Mliss Graff, LPN   [id number]   After Visit Summary: (MyChart) Due to this being a telephonic visit, the after visit summary with patients personalized plan was offered to patient via MyChart   Nurse Notes:

## 2023-11-23 NOTE — Patient Instructions (Signed)
 Ms. Klostermann , Thank you for taking time to come for your Medicare Wellness Visit. I appreciate your ongoing commitment to your health goals. Please review the following plan we discussed and let me know if I can assist you in the future.   Screening recommendations/referrals: Colonoscopy:  Mammogram:  Bone Density: Recommended yearly ophthalmology/optometry visit for glaucoma screening and checkup Recommended yearly dental visit for hygiene and checkup  Vaccinations: Influenza vaccine:  Pneumococcal vaccine:  Tdap vaccine:  Shingles vaccine:       Preventive Care 65 Years and Older, Female Preventive care refers to lifestyle choices and visits with your health care provider that can promote health and wellness. What does preventive care include? A yearly physical exam. This is also called an annual well check. Dental exams once or twice a year. Routine eye exams. Ask your health care provider how often you should have your eyes checked. Personal lifestyle choices, including: Daily care of your teeth and gums. Regular physical activity. Eating a healthy diet. Avoiding tobacco and drug use. Limiting alcohol use. Practicing safe sex. Taking low-dose aspirin every day. Taking vitamin and mineral supplements as recommended by your health care provider. What happens during an annual well check? The services and screenings done by your health care provider during your annual well check will depend on your age, overall health, lifestyle risk factors, and family history of disease. Counseling  Your health care provider may ask you questions about your: Alcohol use. Tobacco use. Drug use. Emotional well-being. Home and relationship well-being. Sexual activity. Eating habits. History of falls. Memory and ability to understand (cognition). Work and work astronomer. Reproductive health. Screening  You may have the following tests or measurements: Height, weight, and BMI. Blood  pressure. Lipid and cholesterol levels. These may be checked every 5 years, or more frequently if you are over 40 years old. Skin check. Lung cancer screening. You may have this screening every year starting at age 23 if you have a 30-pack-year history of smoking and currently smoke or have quit within the past 15 years. Fecal occult blood test (FOBT) of the stool. You may have this test every year starting at age 54. Flexible sigmoidoscopy or colonoscopy. You may have a sigmoidoscopy every 5 years or a colonoscopy every 10 years starting at age 48. Hepatitis C blood test. Hepatitis B blood test. Sexually transmitted disease (STD) testing. Diabetes screening. This is done by checking your blood sugar (glucose) after you have not eaten for a while (fasting). You may have this done every 1-3 years. Bone density scan. This is done to screen for osteoporosis. You may have this done starting at age 5. Mammogram. This may be done every 1-2 years. Talk to your health care provider about how often you should have regular mammograms. Talk with your health care provider about your test results, treatment options, and if necessary, the need for more tests. Vaccines  Your health care provider may recommend certain vaccines, such as: Influenza vaccine. This is recommended every year. Tetanus, diphtheria, and acellular pertussis (Tdap, Td) vaccine. You may need a Td booster every 10 years. Zoster vaccine. You may need this after age 5. Pneumococcal 13-valent conjugate (PCV13) vaccine. One dose is recommended after age 77. Pneumococcal polysaccharide (PPSV23) vaccine. One dose is recommended after age 39. Talk to your health care provider about which screenings and vaccines you need and how often you need them. This information is not intended to replace advice given to you by your health care provider.  Make sure you discuss any questions you have with your health care provider. Document Released:  02/07/2015 Document Revised: 10/01/2015 Document Reviewed: 11/12/2014 Elsevier Interactive Patient Education  2017 [employer].  Fall Prevention in the Home Falls can cause injuries. They can happen to people of all ages. There are many things you can do to make your home safe and to help prevent falls. What can I do on the outside of my home? Regularly fix the edges of walkways and driveways and fix any cracks. Remove anything that might make you trip as you walk through a door, such as a raised step or threshold. Trim any bushes or trees on the path to your home. Use bright outdoor lighting. Clear any walking paths of anything that might make someone trip, such as rocks or tools. Regularly check to see if handrails are loose or broken. Make sure that both sides of any steps have handrails. Any raised decks and porches should have guardrails on the edges. Have any leaves, snow, or ice cleared regularly. Use sand or salt on walking paths during winter. Clean up any spills in your garage right away. This includes oil or grease spills. What can I do in the bathroom? Use night lights. Install grab bars by the toilet and in the tub and shower. Do not use towel bars as grab bars. Use non-skid mats or decals in the tub or shower. If you need to sit down in the shower, use a plastic, non-slip stool. Keep the floor dry. Clean up any water that spills on the floor as soon as it happens. Remove soap buildup in the tub or shower regularly. Attach bath mats securely with double-sided non-slip rug tape. Do not have throw rugs and other things on the floor that can make you trip. What can I do in the bedroom? Use night lights. Make sure that you have a light by your bed that is easy to reach. Do not use any sheets or blankets that are too big for your bed. They should not hang down onto the floor. Have a firm chair that has side arms. You can use this for support while you get dressed. Do not have  throw rugs and other things on the floor that can make you trip. What can I do in the kitchen? Clean up any spills right away. Avoid walking on wet floors. Keep items that you use a lot in easy-to-reach places. If you need to reach something above you, use a strong step stool that has a grab bar. Keep electrical cords out of the way. Do not use floor polish or wax that makes floors slippery. If you must use wax, use non-skid floor wax. Do not have throw rugs and other things on the floor that can make you trip. What can I do with my stairs? Do not leave any items on the stairs. Make sure that there are handrails on both sides of the stairs and use them. Fix handrails that are broken or loose. Make sure that handrails are as long as the stairways. Check any carpeting to make sure that it is firmly attached to the stairs. Fix any carpet that is loose or worn. Avoid having throw rugs at the top or bottom of the stairs. If you do have throw rugs, attach them to the floor with carpet tape. Make sure that you have a light switch at the top of the stairs and the bottom of the stairs. If you do not have them,  ask someone to add them for you. What else can I do to help prevent falls? Wear shoes that: Do not have high heels. Have rubber bottoms. Are comfortable and fit you well. Are closed at the toe. Do not wear sandals. If you use a stepladder: Make sure that it is fully opened. Do not climb a closed stepladder. Make sure that both sides of the stepladder are locked into place. Ask someone to hold it for you, if possible. Clearly mark and make sure that you can see: Any grab bars or handrails. First and last steps. Where the edge of each step is. Use tools that help you move around (mobility aids) if they are needed. These include: Canes. Walkers. Scooters. Crutches. Turn on the lights when you go into a dark area. Replace any light bulbs as soon as they burn out. Set up your furniture so  you have a clear path. Avoid moving your furniture around. If any of your floors are uneven, fix them. If there are any pets around you, be aware of where they are. Review your medicines with your doctor. Some medicines can make you feel dizzy. This can increase your chance of falling. Ask your doctor what other things that you can do to help prevent falls. This information is not intended to replace advice given to you by your health care provider. Make sure you discuss any questions you have with your health care provider. Document Released: 11/07/2008 Document Revised: 06/19/2015 Document Reviewed: 02/15/2014 Elsevier Interactive Patient Education  2017 [employer].

## 2023-12-27 ENCOUNTER — Ambulatory Visit

## 2023-12-27 VITALS — BP 120/74 | HR 75 | Ht 62.0 in | Wt 117.9 lb

## 2023-12-27 DIAGNOSIS — N3941 Urge incontinence: Secondary | ICD-10-CM

## 2023-12-27 NOTE — Progress Notes (Signed)
   GYNECOLOGY OFFICE VISIT NOTE  History:   Jasmine Lee is a 68 y.o. G0P0000 here today for urinary leakage concern  The past 2-3 years she has been having difficulty with leaking urine when I get around my sisters and am having fun and laughing a lot. Reports it is primarily with laughing and occasionally coughing.   She has noticed it has been better the last couple months.  She denies urge incontinence symptoms or overflow incontinence.    Past Medical History:  Diagnosis Date   Allergy    Arthritis    Chicken pox    Endometriosis    GERD (gastroesophageal reflux disease)    Heartburn    History of fainting spells of unknown cause    History of sun-damaged skin 04/26/2014   Actinic keratosis.  Solar elastosis.  Benign nevus of right posterior neck.   HTN (hypertension)    Muscle weakness 01/2017   Patient reports being hospitalized for bilateral upper extremity weakness with negative stroke work-up.  Negative EEG.  Self resolved   Osteomyelitis (HCC) 12/2016   left ankle/foot. Patient had multiple surgeries and treated with antibiotics.   Seasonal allergies    Toxic metabolic encephalopathy 01/2017   Extensive negative work-up reported by PCP records.  Thought to be secondary to IV antibiotics for osteomyelitis.    Past Surgical History:  Procedure Laterality Date   BUNIONECTOMY  10/2016   Patient reports multiple postsurgical infections October/November and December 2018.   COLONOSCOPY  03/31/2012   Nonbleeding internal hemorrhoids otherwise normal exam.  Repeat in 10 years.   ESOPHAGOGASTRODUODENOSCOPY ENDOSCOPY  2022   HERNIA REPAIR  1962   LAPAROSCOPIC ENDOMETRIOSIS FULGURATION  1986   RHINOPLASTY     Completed for deviated septum.   WISDOM TOOTH EXTRACTION      The following portions of the patient's history were reviewed and updated as appropriate: allergies, current medications, past family history, past medical history, past social history, past  surgical history and problem list.   Review of Systems:  Pertinent items noted in HPI and remainder of comprehensive ROS otherwise negative.  Physical Exam:  BP 120/74   Pulse 75   Ht 5' 2 (1.575 m)   Wt 117 lb 14.4 oz (53.5 kg)   BMI 21.56 kg/m  CONSTITUTIONAL: Well-developed, well-nourished female in no acute distress.  HEENT:  Normocephalic, atraumatic. External right and left ear normal. No scleral icterus.  SKIN: No rash noted. Not diaphoretic. No erythema. No pallor. MUSCULOSKELETAL: Normal range of motion. No edema noted. NEUROLOGIC: Gives appropriate history. Normal muscle tone coordination.  PSYCHIATRIC: Normal mood and affect. Normal behavior. Normal judgment and thought content. RESPIRATORY: Effort normal, no problems with respiration noted Abdomen: + bowel sounds, soft, NT/ND without palpable mass   Assessment and Plan:  1. Urge urinary incontinence (Primary) -discussed home Kegal exercises vs referral to pelvic floor PT.  Pt would like to try exercises at home. -counseled mz:xzhjod and given handout -Follow up PRN   Please refer to After Visit Summary for other counseling recommendations.    Total face-to-face time with patient: 20 minutes.  Over 50% of encounter was spent on counseling and coordination of care.   Leeroy KATHEE Pouch, MD [facility], [facility]

## 2023-12-27 NOTE — Progress Notes (Signed)
 Pt presents for annual and urinary incontinence. No other questions or concerns at this time.

## 2024-02-18 ENCOUNTER — Other Ambulatory Visit: Payer: Self-pay | Admitting: Family Medicine

## 2024-02-28 ENCOUNTER — Ambulatory Visit: Admitting: Family Medicine

## 2024-03-07 ENCOUNTER — Ambulatory Visit: Admitting: Family Medicine

## 2024-11-28 ENCOUNTER — Ambulatory Visit
